# Patient Record
Sex: Male | Born: 2000 | Race: White | Hispanic: No | Marital: Single | Smoking: Never smoker
Health system: Southern US, Community
[De-identification: ages and names within clinical notes are randomized; demographics above are authoritative.]

## PROBLEM LIST (undated history)

## (undated) DIAGNOSIS — F840 Autistic disorder: Secondary | ICD-10-CM

## (undated) DIAGNOSIS — R41840 Attention and concentration deficit: Secondary | ICD-10-CM

## (undated) HISTORY — PX: TONSILLECTOMY: SUR1361

---
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
## 2022-05-28 ENCOUNTER — Observation Stay (HOSPITAL_BASED_OUTPATIENT_CLINIC_OR_DEPARTMENT_OTHER)
Admission: EM | Admit: 2022-05-28 | Discharge: 2022-05-29 | Disposition: A | Discharge status: Home / Self Care | Payer: 59 | Attending: General Surgery | Admitting: General Surgery

## 2022-05-28 ENCOUNTER — Emergency Department (HOSPITAL_BASED_OUTPATIENT_CLINIC_OR_DEPARTMENT_OTHER): Payer: 59

## 2022-05-28 ENCOUNTER — Encounter (HOSPITAL_BASED_OUTPATIENT_CLINIC_OR_DEPARTMENT_OTHER): Payer: Self-pay

## 2022-05-28 ENCOUNTER — Other Ambulatory Visit: Payer: Self-pay

## 2022-05-28 DIAGNOSIS — K8050 Calculus of bile duct without cholangitis or cholecystitis without obstruction: Secondary | ICD-10-CM

## 2022-05-28 DIAGNOSIS — K8010 Calculus of gallbladder with chronic cholecystitis without obstruction: Principal | ICD-10-CM | POA: Insufficient documentation

## 2022-05-28 DIAGNOSIS — K8020 Calculus of gallbladder without cholecystitis without obstruction: Secondary | ICD-10-CM | POA: Diagnosis present

## 2022-05-28 DIAGNOSIS — R1033 Periumbilical pain: Secondary | ICD-10-CM | POA: Diagnosis present

## 2022-05-28 HISTORY — DX: Autistic disorder: F84.0

## 2022-05-28 LAB — CBC WITH DIFFERENTIAL/PLATELET
Abs Immature Granulocytes: 0.03 10*3/uL (ref 0.00–0.07)
Basophils Absolute: 0.1 10*3/uL (ref 0.0–0.1)
Basophils Relative: 1 %
Eosinophils Absolute: 0.5 10*3/uL (ref 0.0–0.5)
Eosinophils Relative: 6 %
HCT: 45.2 % (ref 39.0–52.0)
Hemoglobin: 15.8 g/dL (ref 13.0–17.0)
Immature Granulocytes: 0 %
Lymphocytes Relative: 14 %
Lymphs Abs: 1.3 10*3/uL (ref 0.7–4.0)
MCH: 33.3 pg (ref 26.0–34.0)
MCHC: 35 g/dL (ref 30.0–36.0)
MCV: 95.4 fL (ref 80.0–100.0)
Monocytes Absolute: 0.8 10*3/uL (ref 0.1–1.0)
Monocytes Relative: 9 %
Neutro Abs: 6.5 10*3/uL (ref 1.7–7.7)
Neutrophils Relative %: 70 %
Platelets: 274 10*3/uL (ref 150–400)
RBC: 4.74 MIL/uL (ref 4.22–5.81)
RDW: 12.1 % (ref 11.5–15.5)
WBC: 9.2 10*3/uL (ref 4.0–10.5)
nRBC: 0 % (ref 0.0–0.2)

## 2022-05-28 LAB — COMPREHENSIVE METABOLIC PANEL
ALT: 28 U/L (ref 0–44)
AST: 21 U/L (ref 15–41)
Albumin: 4.3 g/dL (ref 3.5–5.0)
Alkaline Phosphatase: 62 U/L (ref 38–126)
Anion gap: 9 (ref 5–15)
BUN: 6 mg/dL (ref 6–20)
CO2: 28 mmol/L (ref 22–32)
Calcium: 9.3 mg/dL (ref 8.9–10.3)
Chloride: 100 mmol/L (ref 98–111)
Creatinine, Ser: 0.8 mg/dL (ref 0.61–1.24)
GFR, Estimated: >60 mL/min (ref >60)
Glucose, Bld: 104 mg/dL — ABNORMAL HIGH (ref 70–99)
Potassium: 3.8 mmol/L (ref 3.5–5.1)
Sodium: 137 mmol/L (ref 135–145)
Total Bilirubin: 1.5 mg/dL — ABNORMAL HIGH (ref 0.3–1.2)
Total Protein: 7.2 g/dL (ref 6.5–8.1)

## 2022-05-28 LAB — URINALYSIS, ROUTINE W REFLEX MICROSCOPIC
Bilirubin Urine: NEGATIVE
Glucose, UA: NEGATIVE mg/dL
Hgb urine dipstick: NEGATIVE
Ketones, ur: NEGATIVE mg/dL
Leukocytes,Ua: NEGATIVE
Nitrite: NEGATIVE
Protein, ur: NEGATIVE mg/dL
Specific Gravity, Urine: 1.015 (ref 1.005–1.030)
pH: 6 (ref 5.0–8.0)

## 2022-05-28 LAB — LIPASE, BLOOD: Lipase: 30 U/L (ref 11–51)

## 2022-05-28 MED ORDER — SODIUM CHLORIDE 0.9 % IV SOLN: Freq: Once | INTRAVENOUS | Status: AC

## 2022-05-28 MED ORDER — DIPHENHYDRAMINE HCL 25 MG PO CAPS: 25.0000 mg | ORAL_CAPSULE | Freq: Four times a day (QID) | ORAL | Status: DC | PRN

## 2022-05-28 MED ORDER — CIPROFLOXACIN IN D5W 400 MG/200ML IV SOLN: 400.0000 mg | Freq: Two times a day (BID) | INTRAVENOUS | Status: DC

## 2022-05-28 MED ORDER — SODIUM CHLORIDE 0.9 % IV SOLN: INTRAVENOUS | Status: DC

## 2022-05-28 MED ORDER — CIPROFLOXACIN IN D5W 400 MG/200ML IV SOLN
400.0000 mg | Freq: Two times a day (BID) | INTRAVENOUS | Status: DC
  Administered 2022-05-29: 400 mg via INTRAVENOUS
  Filled 2022-05-28: qty 200

## 2022-05-28 MED ORDER — ENOXAPARIN SODIUM 40 MG/0.4ML IJ SOSY
40.0000 mg | PREFILLED_SYRINGE | INTRAMUSCULAR | Status: DC
  Administered 2022-05-28: 40 mg via SUBCUTANEOUS
  Filled 2022-05-28: qty 0.4

## 2022-05-28 MED ORDER — DOCUSATE SODIUM 100 MG PO CAPS: 100.0000 mg | ORAL_CAPSULE | Freq: Two times a day (BID) | ORAL | Status: DC | PRN

## 2022-05-28 MED ORDER — DIPHENHYDRAMINE HCL 50 MG/ML IJ SOLN: 25.0000 mg | Freq: Four times a day (QID) | INTRAMUSCULAR | Status: DC | PRN

## 2022-05-28 MED ORDER — SODIUM CHLORIDE 0.9 % IV BOLUS
1000.0000 mL | Freq: Once | INTRAVENOUS | Status: AC
  Administered 2022-05-28: 1000 mL via INTRAVENOUS

## 2022-05-28 MED ORDER — ONDANSETRON HCL 4 MG/2ML IJ SOLN
4.0000 mg | Freq: Once | INTRAMUSCULAR | Status: AC
  Administered 2022-05-28: 4 mg via INTRAVENOUS
  Filled 2022-05-28: qty 2

## 2022-05-28 MED ORDER — METHOCARBAMOL 500 MG PO TABS
500.0000 mg | ORAL_TABLET | Freq: Three times a day (TID) | ORAL | Status: DC | PRN
  Administered 2022-05-29: 500 mg via ORAL
  Filled 2022-05-28: qty 1

## 2022-05-28 MED ORDER — PANTOPRAZOLE SODIUM 40 MG IV SOLR
40.0000 mg | Freq: Every day | INTRAVENOUS | Status: DC
  Administered 2022-05-28: 40 mg via INTRAVENOUS
  Filled 2022-05-28: qty 10

## 2022-05-28 MED ORDER — ONDANSETRON 4 MG PO TBDP: 4.0000 mg | ORAL_TABLET | Freq: Four times a day (QID) | ORAL | Status: DC | PRN

## 2022-05-28 MED ORDER — SIMETHICONE 80 MG PO CHEW
40.0000 mg | CHEWABLE_TABLET | Freq: Four times a day (QID) | ORAL | Status: DC | PRN
  Filled 2022-05-28: qty 1

## 2022-05-28 MED ORDER — METHOCARBAMOL 1000 MG/10ML IJ SOLN
500.0000 mg | Freq: Three times a day (TID) | INTRAVENOUS | Status: DC | PRN
  Filled 2022-05-28: qty 5

## 2022-05-28 MED ORDER — IOHEXOL 300 MG/ML  SOLN
100.0000 mL | Freq: Once | INTRAMUSCULAR | Status: AC | PRN
  Administered 2022-05-28: 100 mL via INTRAVENOUS

## 2022-05-28 MED ORDER — OXYCODONE HCL 5 MG PO TABS
5.0000 mg | ORAL_TABLET | ORAL | Status: DC | PRN
  Administered 2022-05-29: 10 mg via ORAL
  Filled 2022-05-28: qty 2

## 2022-05-28 MED ORDER — HYDROMORPHONE HCL 1 MG/ML IJ SOLN: 0.5000 mg | INTRAMUSCULAR | Status: DC | PRN

## 2022-05-28 MED ORDER — MELATONIN 3 MG PO TABS
3.0000 mg | ORAL_TABLET | Freq: Every evening | ORAL | Status: DC | PRN
  Administered 2022-05-28: 3 mg via ORAL
  Filled 2022-05-28: qty 1

## 2022-05-28 MED ORDER — ACETAMINOPHEN 650 MG RE SUPP: 650.0000 mg | Freq: Four times a day (QID) | RECTAL | Status: DC | PRN

## 2022-05-28 MED ORDER — ACETAMINOPHEN 325 MG PO TABS: 650.0000 mg | ORAL_TABLET | Freq: Four times a day (QID) | ORAL | Status: DC | PRN

## 2022-05-28 MED ORDER — SODIUM CHLORIDE 0.9 % IV SOLN
2.0000 g | Freq: Once | INTRAVENOUS | Status: AC
  Administered 2022-05-28: 2 g via INTRAVENOUS
  Filled 2022-05-28: qty 20

## 2022-05-28 MED ORDER — ONDANSETRON HCL 4 MG/2ML IJ SOLN: 4.0000 mg | Freq: Four times a day (QID) | INTRAMUSCULAR | Status: DC | PRN

## 2022-05-28 MED ORDER — METOPROLOL TARTRATE 5 MG/5ML IV SOLN: 5.0000 mg | Freq: Four times a day (QID) | INTRAVENOUS | Status: DC | PRN

## 2022-05-28 NOTE — ED Notes (Signed)
Report given to Alva Garnet, [facility] Paramedic.

## 2022-05-28 NOTE — ED Notes (Signed)
Pt unable to provide urine specimen at this time. Pt and mother aware to notify nurse when able to void

## 2022-05-28 NOTE — ED Notes (Addendum)
Called [facility] for Transport at 4:43

## 2022-05-28 NOTE — ED Provider Notes (Signed)
[place] [facility] Provider Note   CSN: [id number] Arrival date & time: 05/28/22  1028     History  Chief Complaint  Patient presents with   Abdominal Pain    Jonathan Cardenas is a 22 y.o. male.  He has PMH of autism.  Presents to ED accompanied by his parents.  He is complaining today of abdominal pain located in the periumbilical area described as sharp and constant that lasted for about 3 hours this morning.  If finally improved enough where he was able to get in the car to come to the ED after taking Rolaids, Pepcid and Pepto-Bismol.    He has been having problems on and off for the past several months.  His mother reports that he has a lot of sensory issues with food and primarily eats goldfish crackers for snacks.    He was having episodes that were less intense and less frequent previously that would resolve on their own.  His PCP recommended magnesium and fiber supplements which did not seem to change the symptoms, though his mother reports they were not doing it consistently.  Over the past week the pain has been lasting longer but more intense, was much worse this morning, patient states he vomited profusely and his mother reports he was curled up in a ball and his abdomen fell very hard when this was going on.  He denies hematemesis, no hematochezia or melena, reports daily bowel movements, no urinary symptoms.  Abdominal Pain      Home Medications Prior to Admission medications   Medication Sig Start Date End Date Taking? Authorizing Provider  cephALEXin (KEFLEX) 250 MG capsule Take 1 capsule (250 mg total) by mouth 3 (three) times daily. 01/30/12   Gwyneth Sprout, MD  ibuprofen (ADVIL,MOTRIN) 200 MG tablet Take 200 mg by mouth every 6 (six) hours as needed.    [provider]  lisdexamfetamine (VYVANSE) 50 MG capsule Take 50 mg by mouth daily.    [provider]  NON FORMULARY     [provider]   ondansetron (ZOFRAN) 4 MG tablet Take 1 tablet (4 mg total) by mouth every 8 (eight) hours as needed for nausea or vomiting. 04/01/15   Rolan Bucco, MD      Allergies    Penicillins    Review of Systems   Review of Systems  Gastrointestinal:  Positive for abdominal pain.    Physical Exam Updated Vital Signs BP (!) 123/95   Pulse 84   Temp 97.9 F (36.6 C) (Oral)   Resp 15   Ht 6' (1.829 m)   Wt 77.1 kg   SpO2 100%   BMI 23.06 kg/m  Physical Exam Vitals and nursing note reviewed.  Constitutional:      General: He is not in acute distress.    Appearance: He is well-developed.  HENT:     Head: Normocephalic and atraumatic.  Eyes:     Conjunctiva/sclera: Conjunctivae normal.  Cardiovascular:     Rate and Rhythm: Normal rate and regular rhythm.     Heart sounds: No murmur heard. Pulmonary:     Effort: Pulmonary effort is normal. No respiratory distress.     Breath sounds: Normal breath sounds.  Abdominal:     Palpations: Abdomen is soft.     Tenderness: There is abdominal tenderness in the right upper quadrant, epigastric area and periumbilical area. There is no right CVA tenderness, left CVA tenderness or rebound. Negative signs include  Murphy's sign and McBurney's sign.  Musculoskeletal:        General: No swelling.     Cervical back: Neck supple.  Skin:    General: Skin is warm and dry.     Capillary Refill: Capillary refill takes less than 2 seconds.  Neurological:     Mental Status: He is alert.  Psychiatric:        Mood and Affect: Mood normal.     ED Results / Procedures / Treatments   Labs (all labs ordered are listed, but only abnormal results are displayed) Labs Reviewed  COMPREHENSIVE METABOLIC PANEL - Abnormal; Notable for the following components:      Result Value   Glucose, Bld 104 (*)    Total Bilirubin 1.5 (*)    All other components within normal limits  CBC WITH DIFFERENTIAL/PLATELET  LIPASE, BLOOD  URINALYSIS, ROUTINE W REFLEX  MICROSCOPIC    EKG None  Radiology US Abdomen Limited RUQ (LIVER/GB)  Result Date: 05/28/2022 CLINICAL DATA:  Acute abdominal pain EXAM: ULTRASOUND ABDOMEN LIMITED RIGHT UPPER QUADRANT COMPARISON:  CT abdomen and pelvis 05/28/2022 FINDINGS: Gallbladder: The single gallstone is seen in the gallbladder neck measuring up to 1.9 cm. Sonographic Eulah Pont sign is negative. There is mild diffuse gallbladder wall thickening measuring up to 4 mm. There is no pericholecystic fluid. Common bile duct: Diameter: 2.7 mm.  No intrahepatic biliary dilatation. Liver: No focal lesion identified. Within normal limits in parenchymal echogenicity. Portal vein is patent on color Doppler imaging with normal direction of blood flow towards the liver. Other: None. IMPRESSION: Cholelithiasis with mild diffuse gallbladder wall thickening. Sonographic Eulah Pont sign is negative. Findings are equivocal for acute cholecystitis. Electronically Signed   By: Darliss Cheney M.D.   On: 05/28/2022 13:50   CT ABDOMEN PELVIS W CONTRAST  Result Date: 05/28/2022 CLINICAL DATA:  Abdominal pain, acute, nonlocalized EXAM: CT ABDOMEN AND PELVIS WITH CONTRAST TECHNIQUE: Multidetector CT imaging of the abdomen and pelvis was performed using the standard protocol following bolus administration of intravenous contrast. RADIATION DOSE REDUCTION: This exam was performed according to the departmental dose-optimization program which includes automated exposure control, adjustment of the mA and/or kV according to patient size and/or use of iterative reconstruction technique. CONTRAST:  OMNIPAQUE IOHEXOL 300 MG/ML  SOLN COMPARISON:  None Available. FINDINGS: Lower chest: No acute abnormality. Hepatobiliary: Large gallstone. Gallbladder wall thickening. No biliary dilation. No suspicious hepatic lesion. Mild focal fatty sparing along the falciform ligament. Pancreas: Unremarkable. No pancreatic ductal dilatation or surrounding inflammatory changes. Spleen:  Normal in size without focal abnormality. Adrenals/Urinary Tract: Adrenal glands are unremarkable. Kidneys are normal, without renal calculi, focal lesion, or hydronephrosis. Bladder is unremarkable. Stomach/Bowel: Stomach is within normal limits. Appendix appears normal. No evidence of bowel wall thickening, distention, or inflammatory changes. Vascular/Lymphatic: No significant vascular findings are present. No enlarged abdominal or pelvic lymph nodes. Reproductive: Prostate is unremarkable. Partially imaged edema in the left scrotum. Other: No abdominal wall hernia or abnormality. No abdominopelvic ascites. Musculoskeletal: No acute or significant osseous findings. IMPRESSION: 1. Large gallstone with gallbladder wall thickening, suspicious for cholecystitis. An ultrasound could further evaluate if clinically warranted. 2. Partially imaged edema in the left scrotum, possibly hydrocele. Scrotal ultrasound could further characterize if clinically warranted. Electronically Signed   By: Feliberto Harts M.D.   On: 05/28/2022 12:58    Procedures Procedures    Medications Ordered in ED Medications  0.9 %  sodium chloride infusion (has no administration in time range)  cefTRIAXone (ROCEPHIN)  2 g in sodium chloride 0.9 % 100 mL IVPB (has no administration in time range)  ondansetron (ZOFRAN) injection 4 mg (4 mg Intravenous Given 05/28/22 1115)  sodium chloride 0.9 % bolus 1,000 mL (0 mLs Intravenous Stopped 05/28/22 1420)  iohexol (OMNIPAQUE) 300 MG/ML solution 100 mL (100 mLs Intravenous Contrast Given 05/28/22 1229)    ED Course/ Medical Decision Making/ A&P Clinical Course as of 05/28/22 1630  Wed May 28, 2022  1406 Equivocal for acute cholecystitis, patient does have some mild right upper quadrant tenderness but negative Murphy sign, general surgery consulted, awaiting callback at this time.  Patient states pain is now significantly improved from this morning [CB]  1619 Patient having worsening  colicky pain, have a gallstone in his gallbladder neck, gallbladder wall thickening with right upper quadrant tenderness, negative Murphy sign, equivocal for acute cholecystitis.  Discussed with general surgery.  I discussed case with Johnny Bridge the on-call PA who states they will give the patient some IV antibiotics and fluids, patient will be transferred to [facility] long with plan for n.p.o. after midnight and cholecystectomy in the morning.  Patient and family agreeable with this. [CB]    Clinical Course User Index [CB] Ma Rings, PA-C                             Medical Decision Making This patient presents to the ED for concern of abdominal pain, this involves an extensive number of treatment options, and is a complaint that carries with it a high risk of complications and morbidity.  The differential diagnosis includes gastritis, gastroenteritis, appendicitis, cholecystitis, diverticulitis, DKA, nephrolithiasis, gastroparesis, other     Additional history obtained:  Additional history obtained from EMR External records from outside source obtained and reviewed including prior PCP notes   Lab Tests:  I Ordered, and personally interpreted labs.  The pertinent results include:  CBC, CMP lipase normal    Imaging Studies ordered:  I ordered imaging studies including ct abd/pelvis  I independently visualized and interpreted imaging which showed large gallstone with gallbladder wall thickening I agree with the radiologist interpretation     Problem List / ED Course / Critical interventions / Medication management  Counseled with worsening continued colicky abdominal pain, CT scan ordered due to persistent and worsening symptoms.  Shows some gallbladder wall thickening and a large gallstone.  Ultrasound ordered for further delineation.  Patient does have a stone in his gallbladder neck and gallbladder wall thickening on ultrasound.  He is very comfortable without any pain meds in  the ED early cholecystitis versus biliary colic.  Discussed with Johnny Bridge, the on-call PA from surgery.  Will plan for admission to [facility] for antibiotics and fluids overnight, patient will remain n.p.o. after midnight and plan on cholecystectomy in the morning.  Discussed with patient and his parents who are agreeable with this.  I ordered medication including zofran for nausea  Reevaluation of the patient after these medicines showed that the patient improved I have reviewed the patients home medicines and have made adjustments as needed       Amount and/or Complexity of Data Reviewed Labs: ordered. Radiology: ordered.  Risk Prescription drug management. Decision regarding hospitalization.           Final Clinical Impression(s) / ED Diagnoses Final diagnoses:  Biliary colic    Rx / DC Orders ED Discharge Orders     None  Josem Kaufmann 05/28/22 1630    Rondel Baton, MD 05/28/22 1700

## 2022-05-28 NOTE — ED Triage Notes (Signed)
Has been having intermittent abdominal pain x 3 months. C/o vomiting x 5 times today.   Autistic, mother states has a limited diet.

## 2022-05-28 NOTE — ED Notes (Signed)
[facility] in to transport to [facility] 

## 2022-05-28 NOTE — ED Notes (Signed)
Called [facility] for consult talked to [facility] at 4::43

## 2022-05-29 ENCOUNTER — Encounter (HOSPITAL_COMMUNITY): Payer: Self-pay

## 2022-05-29 ENCOUNTER — Other Ambulatory Visit: Payer: Self-pay

## 2022-05-29 ENCOUNTER — Observation Stay (HOSPITAL_BASED_OUTPATIENT_CLINIC_OR_DEPARTMENT_OTHER): Payer: 59 | Admitting: Anesthesiology

## 2022-05-29 ENCOUNTER — Encounter (HOSPITAL_COMMUNITY)
Admission: EM | Disposition: A | Discharge status: Home / Self Care | Payer: Self-pay | Source: Home / Self Care | Attending: Emergency Medicine

## 2022-05-29 ENCOUNTER — Observation Stay (HOSPITAL_COMMUNITY): Payer: 59 | Admitting: Anesthesiology

## 2022-05-29 DIAGNOSIS — K8020 Calculus of gallbladder without cholecystitis without obstruction: Secondary | ICD-10-CM

## 2022-05-29 DIAGNOSIS — K8010 Calculus of gallbladder with chronic cholecystitis without obstruction: Secondary | ICD-10-CM | POA: Diagnosis not present

## 2022-05-29 DIAGNOSIS — F909 Attention-deficit hyperactivity disorder, unspecified type: Secondary | ICD-10-CM

## 2022-05-29 HISTORY — PX: CHOLECYSTECTOMY: SHX55

## 2022-05-29 LAB — BASIC METABOLIC PANEL
Anion gap: 9 (ref 5–15)
BUN: 7 mg/dL (ref 6–20)
CO2: 27 mmol/L (ref 22–32)
Calcium: 8.9 mg/dL (ref 8.9–10.3)
Chloride: 102 mmol/L (ref 98–111)
Creatinine, Ser: 0.89 mg/dL (ref 0.61–1.24)
GFR, Estimated: >60 mL/min (ref >60)
Glucose, Bld: 89 mg/dL (ref 70–99)
Potassium: 3.5 mmol/L (ref 3.5–5.1)
Sodium: 138 mmol/L (ref 135–145)

## 2022-05-29 LAB — CBC
HCT: 42.4 % (ref 39.0–52.0)
Hemoglobin: 14.6 g/dL (ref 13.0–17.0)
MCH: 33.8 pg (ref 26.0–34.0)
MCHC: 34.4 g/dL (ref 30.0–36.0)
MCV: 98.1 fL (ref 80.0–100.0)
Platelets: 218 10*3/uL (ref 150–400)
RBC: 4.32 MIL/uL (ref 4.22–5.81)
RDW: 12.3 % (ref 11.5–15.5)
WBC: 6.6 10*3/uL (ref 4.0–10.5)
nRBC: 0 % (ref 0.0–0.2)

## 2022-05-29 LAB — HIV ANTIBODY (ROUTINE TESTING W REFLEX): HIV Screen 4th Generation wRfx: NONREACTIVE

## 2022-05-29 SURGERY — LAPAROSCOPIC CHOLECYSTECTOMY WITH INTRAOPERATIVE CHOLANGIOGRAM
Anesthesia: General

## 2022-05-29 MED ORDER — 0.9 % SODIUM CHLORIDE (POUR BTL) OPTIME
TOPICAL | Status: DC | PRN
  Administered 2022-05-29: 1000 mL

## 2022-05-29 MED ORDER — ONDANSETRON HCL 4 MG/2ML IJ SOLN
INTRAMUSCULAR | Status: AC
  Filled 2022-05-29: qty 2

## 2022-05-29 MED ORDER — BUPIVACAINE HCL (PF) 0.25 % IJ SOLN
INTRAMUSCULAR | Status: DC | PRN
  Administered 2022-05-29: 30 mL

## 2022-05-29 MED ORDER — LACTATED RINGERS IV SOLN: INTRAVENOUS | Status: DC

## 2022-05-29 MED ORDER — FENTANYL CITRATE (PF) 100 MCG/2ML IJ SOLN
INTRAMUSCULAR | Status: DC | PRN
  Administered 2022-05-29: 50 ug via INTRAVENOUS
  Administered 2022-05-29: 100 ug via INTRAVENOUS
  Administered 2022-05-29: 50 ug via INTRAVENOUS

## 2022-05-29 MED ORDER — LABETALOL HCL 5 MG/ML IV SOLN
INTRAVENOUS | Status: AC
  Filled 2022-05-29: qty 4

## 2022-05-29 MED ORDER — LABETALOL HCL 5 MG/ML IV SOLN
INTRAVENOUS | Status: DC | PRN
  Administered 2022-05-29 (×2): 5 mg via INTRAVENOUS

## 2022-05-29 MED ORDER — FENTANYL CITRATE (PF) 100 MCG/2ML IJ SOLN
INTRAMUSCULAR | Status: AC
  Filled 2022-05-29: qty 2

## 2022-05-29 MED ORDER — MIDAZOLAM HCL 2 MG/2ML IJ SOLN
INTRAMUSCULAR | Status: DC | PRN
  Administered 2022-05-29: 2 mg via INTRAVENOUS

## 2022-05-29 MED ORDER — LIDOCAINE HCL (PF) 2 % IJ SOLN
INTRAMUSCULAR | Status: AC
  Filled 2022-05-29: qty 5

## 2022-05-29 MED ORDER — ROCURONIUM BROMIDE 10 MG/ML (PF) SYRINGE
PREFILLED_SYRINGE | INTRAVENOUS | Status: DC | PRN
  Administered 2022-05-29: 60 mg via INTRAVENOUS

## 2022-05-29 MED ORDER — DEXMEDETOMIDINE HCL IN NACL 80 MCG/20ML IV SOLN
INTRAVENOUS | Status: DC | PRN
  Administered 2022-05-29: 8 ug via INTRAVENOUS
  Administered 2022-05-29 (×2): 12 ug via INTRAVENOUS

## 2022-05-29 MED ORDER — ACETAMINOPHEN 160 MG/5ML PO SOLN: 325.0000 mg | ORAL | Status: DC | PRN

## 2022-05-29 MED ORDER — OXYCODONE HCL 5 MG PO TABS: 5.0000 mg | ORAL_TABLET | Freq: Once | ORAL | Status: DC | PRN

## 2022-05-29 MED ORDER — DEXAMETHASONE SODIUM PHOSPHATE 10 MG/ML IJ SOLN
INTRAMUSCULAR | Status: AC
  Filled 2022-05-29: qty 1

## 2022-05-29 MED ORDER — OXYCODONE HCL 5 MG/5ML PO SOLN: 5.0000 mg | Freq: Once | ORAL | Status: DC | PRN

## 2022-05-29 MED ORDER — ACETAMINOPHEN 10 MG/ML IV SOLN
INTRAVENOUS | Status: DC | PRN
  Administered 2022-05-29: 1000 mg via INTRAVENOUS

## 2022-05-29 MED ORDER — ONDANSETRON HCL 4 MG/2ML IJ SOLN: 4.0000 mg | Freq: Once | INTRAMUSCULAR | Status: DC | PRN

## 2022-05-29 MED ORDER — FENTANYL CITRATE PF 50 MCG/ML IJ SOSY
PREFILLED_SYRINGE | INTRAMUSCULAR | Status: AC
  Administered 2022-05-29: 50 ug via INTRAVENOUS
  Filled 2022-05-29: qty 2

## 2022-05-29 MED ORDER — DEXAMETHASONE SODIUM PHOSPHATE 10 MG/ML IJ SOLN
INTRAMUSCULAR | Status: DC | PRN
  Administered 2022-05-29: 10 mg via INTRAVENOUS

## 2022-05-29 MED ORDER — IBUPROFEN 800 MG PO TABS
800.0000 mg | ORAL_TABLET | Freq: Three times a day (TID) | ORAL | 0 refills | Status: AC | PRN
Start: 2022-05-29 — End: ?

## 2022-05-29 MED ORDER — MEPERIDINE HCL 50 MG/ML IJ SOLN: 6.2500 mg | INTRAMUSCULAR | Status: DC | PRN

## 2022-05-29 MED ORDER — OXYCODONE HCL 5 MG PO TABS
5.0000 mg | ORAL_TABLET | Freq: Four times a day (QID) | ORAL | 0 refills | Status: DC | PRN
Start: 2022-05-29 — End: 2023-09-22

## 2022-05-29 MED ORDER — SUGAMMADEX SODIUM 200 MG/2ML IV SOLN
INTRAVENOUS | Status: DC | PRN
  Administered 2022-05-29: 200 mg via INTRAVENOUS

## 2022-05-29 MED ORDER — ROCURONIUM BROMIDE 10 MG/ML (PF) SYRINGE
PREFILLED_SYRINGE | INTRAVENOUS | Status: AC
  Filled 2022-05-29: qty 10

## 2022-05-29 MED ORDER — ACETAMINOPHEN 325 MG PO TABS: 325.0000 mg | ORAL_TABLET | ORAL | Status: DC | PRN

## 2022-05-29 MED ORDER — PROPOFOL 10 MG/ML IV BOLUS
INTRAVENOUS | Status: AC
  Filled 2022-05-29: qty 20

## 2022-05-29 MED ORDER — IBUPROFEN 200 MG PO TABS: 800.0000 mg | ORAL_TABLET | Freq: Three times a day (TID) | ORAL | Status: DC | PRN

## 2022-05-29 MED ORDER — LACTATED RINGERS IR SOLN
Status: DC | PRN
  Administered 2022-05-29: 1000 mL

## 2022-05-29 MED ORDER — BUPIVACAINE HCL (PF) 0.25 % IJ SOLN
INTRAMUSCULAR | Status: AC
  Filled 2022-05-29: qty 30

## 2022-05-29 MED ORDER — FENTANYL CITRATE PF 50 MCG/ML IJ SOSY
25.0000 ug | PREFILLED_SYRINGE | INTRAMUSCULAR | Status: DC | PRN
  Administered 2022-05-29: 50 ug via INTRAVENOUS

## 2022-05-29 MED ORDER — CHLORHEXIDINE GLUCONATE 0.12 % MT SOLN
15.0000 mL | Freq: Once | OROMUCOSAL | Status: AC
  Administered 2022-05-29: 15 mL via OROMUCOSAL

## 2022-05-29 MED ORDER — PROPOFOL 10 MG/ML IV BOLUS
INTRAVENOUS | Status: DC | PRN
  Administered 2022-05-29: 200 mg via INTRAVENOUS

## 2022-05-29 MED ORDER — MIDAZOLAM HCL 2 MG/2ML IJ SOLN
INTRAMUSCULAR | Status: AC
  Filled 2022-05-29: qty 2

## 2022-05-29 MED ORDER — ONDANSETRON HCL 4 MG/2ML IJ SOLN
INTRAMUSCULAR | Status: DC | PRN
  Administered 2022-05-29: 4 mg via INTRAVENOUS

## 2022-05-29 MED ORDER — ACETAMINOPHEN 10 MG/ML IV SOLN
INTRAVENOUS | Status: AC
  Filled 2022-05-29: qty 100

## 2022-05-29 MED ORDER — LIDOCAINE 2% (20 MG/ML) 5 ML SYRINGE
INTRAMUSCULAR | Status: DC | PRN
  Administered 2022-05-29: 100 mg via INTRAVENOUS

## 2022-05-29 MED ORDER — DEXMEDETOMIDINE HCL IN NACL 80 MCG/20ML IV SOLN
INTRAVENOUS | Status: AC
  Filled 2022-05-29: qty 20

## 2022-05-29 SURGICAL SUPPLY — 50 items
ADH SKN CLS LQ APL DERMABOND (GAUZE/BANDAGES/DRESSINGS) ×1
APL PRP STRL LF DISP 70% ISPRP (MISCELLANEOUS) ×1
APL SKNCLS STERI-STRIP NONHPOA (GAUZE/BANDAGES/DRESSINGS)
APPLIER CLIP 5 13 M/L LIGAMAX5 (MISCELLANEOUS) ×1
APPLIER CLIP ROT 10 11.4 M/L (STAPLE)
APR CLP MED LRG 11.4X10 (STAPLE)
APR CLP MED LRG 5 ANG JAW (MISCELLANEOUS) ×1
BAG COUNTER SPONGE SURGICOUNT (BAG) IMPLANT
BAG SPEC RTRVL 10 TROC 200 (ENDOMECHANICALS) ×1
BAG SPNG CNTER NS LX DISP (BAG)
BENZOIN TINCTURE PRP APPL 2/3 (GAUZE/BANDAGES/DRESSINGS) IMPLANT
BNDG ADH 1X3 SHEER STRL LF (GAUZE/BANDAGES/DRESSINGS) IMPLANT
BNDG ADH THN 3X1 STRL LF (GAUZE/BANDAGES/DRESSINGS)
CABLE HIGH FREQUENCY MONO STRZ (ELECTRODE) ×1 IMPLANT
CHLORAPREP W/TINT 26 (MISCELLANEOUS) ×1 IMPLANT
CLIP APPLIE 5 13 M/L LIGAMAX5 (MISCELLANEOUS) ×1 IMPLANT
CLIP APPLIE ROT 10 11.4 M/L (STAPLE) IMPLANT
CLIP LIGATING HEM O LOK PURPLE (MISCELLANEOUS) IMPLANT
CLIP LIGATING HEMO O LOK GREEN (MISCELLANEOUS) ×1 IMPLANT
COVER MAYO STAND XLG (MISCELLANEOUS) ×1 IMPLANT
COVER SURGICAL LIGHT HANDLE (MISCELLANEOUS) ×1 IMPLANT
DERMABOND ADVANCED .7 DNX6 (GAUZE/BANDAGES/DRESSINGS) IMPLANT
DRAIN CHANNEL 19F RND (DRAIN) IMPLANT
DRAPE C-ARM 42X120 X-RAY (DRAPES) IMPLANT
EVACUATOR SILICONE 100CC (DRAIN) IMPLANT
GLOVE BIOGEL PI IND STRL 7.0 (GLOVE) ×1 IMPLANT
GLOVE SURG SS PI 7.0 STRL IVOR (GLOVE) ×1 IMPLANT
GOWN STRL REUS W/ TWL LRG LVL3 (GOWN DISPOSABLE) ×1 IMPLANT
GOWN STRL REUS W/TWL LRG LVL3 (GOWN DISPOSABLE) ×1
GRASPER SUT TROCAR 14GX15 (MISCELLANEOUS) ×1 IMPLANT
IRRIG SUCT STRYKERFLOW 2 WTIP (MISCELLANEOUS) ×1
IRRIGATION SUCT STRKRFLW 2 WTP (MISCELLANEOUS) ×1 IMPLANT
KIT BASIN OR (CUSTOM PROCEDURE TRAY) ×1 IMPLANT
KIT TURNOVER KIT A (KITS) IMPLANT
POUCH RETRIEVAL ECOSAC 10 (ENDOMECHANICALS) ×1 IMPLANT
POUCH RETRIEVAL ECOSAC 10MM (ENDOMECHANICALS) ×1
SCISSORS LAP 5X35 DISP (ENDOMECHANICALS) ×1 IMPLANT
SET CHOLANGIOGRAPH MIX (MISCELLANEOUS) IMPLANT
SET TUBE SMOKE EVAC HIGH FLOW (TUBING) ×1 IMPLANT
SLEEVE Z-THREAD 5X100MM (TROCAR) ×2 IMPLANT
SPIKE FLUID TRANSFER (MISCELLANEOUS) ×1 IMPLANT
STRIP CLOSURE SKIN 1/2X4 (GAUZE/BANDAGES/DRESSINGS) IMPLANT
SUT ETHILON 2 0 PS N (SUTURE) IMPLANT
SUT MNCRL AB 4-0 PS2 18 (SUTURE) ×1 IMPLANT
SUT VICRYL 0 ENDOLOOP (SUTURE) IMPLANT
TOWEL OR 17X26 10 PK STRL BLUE (TOWEL DISPOSABLE) ×1 IMPLANT
TOWEL OR NON WOVEN STRL DISP B (DISPOSABLE) IMPLANT
TRAY LAPAROSCOPIC (CUSTOM PROCEDURE TRAY) ×1 IMPLANT
TROCAR ADV FIXATION 12X100MM (TROCAR) ×1 IMPLANT
TROCAR Z-THREAD OPTICAL 5X100M (TROCAR) ×1 IMPLANT

## 2022-05-29 NOTE — Discharge Instructions (Signed)
[facility] ° °Please arrive at least 30 min before your appointment to complete your check in paperwork.  If you are unable to arrive 30 min prior to your appointment time we may have to cancel or reschedule you. °LAPAROSCOPIC SURGERY: POST OP INSTRUCTIONS °Always review your discharge instruction sheet given to you by the facility where your surgery was performed. °IF YOU HAVE DISABILITY OR FAMILY LEAVE FORMS, YOU MUST BRING THEM TO THE OFFICE FOR PROCESSING.   °DO NOT GIVE THEM TO YOUR DOCTOR. ° °PAIN CONTROL ° °First take acetaminophen (Tylenol) AND/or ibuprofen (Advil) to control your pain after surgery.  Follow directions on package.  Taking acetaminophen (Tylenol) and/or ibuprofen (Advil) regularly after surgery will help to control your pain and lower the amount of prescription pain medication you may need.  You should not take more than 4,000 mg (4 grams) of acetaminophen (Tylenol) in 24 hours.  You should not take ibuprofen (Advil), aleve, motrin, naprosyn or other NSAIDS if you have a history of stomach ulcers or chronic kidney disease.  °A prescription for pain medication may be given to you upon discharge.  Take your pain medication as prescribed, if you still have uncontrolled pain after taking acetaminophen (Tylenol) or ibuprofen (Advil). °Use ice packs to help control pain. °If you need a refill on your pain medication, please contact your pharmacy.  They will contact our office to request authorization. Prescriptions will not be filled after 5pm or on week-ends. ° °HOME MEDICATIONS °Take your usually prescribed medications unless otherwise directed. ° °DIET °You should follow a light diet the first few days after arrival home.  Be sure to include lots of fluids daily. Avoid fatty, fried foods.  ° °CONSTIPATION °It is common to experience some constipation after surgery and if you are taking pain medication.  Increasing fluid intake and taking a stool softener (such as Colace)  will usually help or prevent this problem from occurring.  A mild laxative (Milk of Magnesia or Miralax) should be taken according to package instructions if there are no bowel movements after 48 hours. ° °WOUND/INCISION CARE °Most patients will experience some swelling and bruising in the area of the incisions.  Ice packs will help.  Swelling and bruising can take several days to resolve.  °Unless discharge instructions indicate otherwise, follow guidelines below  °STERI-STRIPS - you may remove your outer bandages 48 hours after surgery, and you may shower at that time.  You have steri-strips (small skin tapes) in place directly over the incision.  These strips should be left on the skin for 7-10 days.   °DERMABOND/SKIN GLUE - you may shower in 24 hours.  The glue will flake off over the next 2-3 weeks. °Any sutures or staples will be removed at the office during your follow-up visit. ° °ACTIVITIES °You may resume regular (light) daily activities beginning the next day--such as daily self-care, walking, climbing stairs--gradually increasing activities as tolerated.  You may have sexual intercourse when it is comfortable.  Refrain from any heavy lifting or straining until approved by your doctor. °You may drive when you are no longer taking prescription pain medication, you can comfortably wear a seatbelt, and you can safely maneuver your car and apply brakes. ° °FOLLOW-UP °You should see your doctor in the office for a follow-up appointment approximately 2-3 weeks after your surgery.  You should have been given your post-op/follow-up appointment when your surgery was scheduled.  If you did not receive a post-op/follow-up appointment, make sure   that you call for this appointment within a day or two after you arrive home to insure a convenient appointment time. ° ° °WHEN TO CALL YOUR DOCTOR: °Fever over 101.0 °Inability to urinate °Continued bleeding from incision. °Increased pain, redness, or drainage from the  incision. °Increasing abdominal pain ° °The clinic staff is available to answer your questions during regular business hours.  Please don’t hesitate to call and ask to speak to one of the nurses for clinical concerns.  If you have a medical emergency, go to the nearest emergency room or call 911.  A surgeon from [facility] is always on call at the hospital. °[address], [address], [place], [place]  [address] ? [address] [address], , [place]   [address] °[phone] ? [phone] ? FAX [phone] ° ° ° ° °Managing Your Pain After Surgery Without Opioids ° ° ° °Thank you for participating in our program to help patients manage their pain after surgery without opioids. This is part of our effort to provide you with the best care possible, without exposing you or your family to the risk that opioids pose. ° °What pain can I expect after surgery? °You can expect to have some pain after surgery. This is normal. The pain is typically worse the day after surgery, and quickly begins to get better. °Many studies have found that many patients are able to manage their pain after surgery with Over-the-Counter (OTC) medications such as Tylenol and Motrin. If you have a condition that does not allow you to take Tylenol or Motrin, notify your surgical team. ° °How will I manage my pain? °The best strategy for controlling your pain after surgery is around the clock pain control with Tylenol (acetaminophen) and Motrin (ibuprofen or Advil). Alternating these medications with each other allows you to maximize your pain control. In addition to Tylenol and Motrin, you can use heating pads or ice packs on your incisions to help reduce your pain. ° °How will I alternate your regular strength over-the-counter pain medication? °You will take a dose of pain medication every three hours. °Start by taking 650 mg of Tylenol (2 pills of 325 mg) °3 hours later take 600 mg of Motrin (3 pills of 200 mg) °3 hours after  taking the Motrin take 650 mg of Tylenol °3 hours after that take 600 mg of Motrin. ° ° °- 1 - ° °See example - if your first dose of Tylenol is at 12:00 PM ° ° °12:00 PM Tylenol 650 mg (2 pills of 325 mg)  °3:00 PM Motrin 600 mg (3 pills of 200 mg)  °6:00 PM Tylenol 650 mg (2 pills of 325 mg)  °9:00 PM Motrin 600 mg (3 pills of 200 mg)  °Continue alternating every 3 hours  ° °We recommend that you follow this schedule around-the-clock for at least 3 days after surgery, or until you feel that it is no longer needed. Use the table on the last page of this handout to keep track of the medications you are taking. °Important: °Do not take more than 3000mg of Tylenol or 3200mg of Motrin in a 24-hour period. °Do not take ibuprofen/Motrin if you have a history of bleeding stomach ulcers, severe kidney disease, &/or actively taking a blood thinner ° °What if I still have pain? °If you have pain that is not controlled with the over-the-counter pain medications (Tylenol and Motrin or Advil) you might have what we call “breakthrough” pain. You will receive a prescription   for a small amount of an opioid pain medication such as Oxycodone, Tramadol, or Tylenol with Codeine. Use these opioid pills in the first 24 hours after surgery if you have breakthrough pain. Do not take more than 1 pill every 4-6 hours. ° °If you still have uncontrolled pain after using all opioid pills, don't hesitate to call our staff using the number provided. We will help make sure you are managing your pain in the best way possible, and if necessary, we can provide a prescription for additional pain medication. ° ° °Day 1   ° °Time  °Name of Medication Number of pills taken  °Amount of Acetaminophen  °Pain Level  ° °Comments  °AM PM       °AM PM       °AM PM       °AM PM       °AM PM       °AM PM       °AM PM       °AM PM       °Total Daily amount of Acetaminophen °Do not take more than  3,000 mg per day    ° ° °Day 2   ° °Time  °Name of Medication  Number of pills °taken  °Amount of Acetaminophen  °Pain Level  ° °Comments  °AM PM       °AM PM       °AM PM       °AM PM       °AM PM       °AM PM       °AM PM       °AM PM       °Total Daily amount of Acetaminophen °Do not take more than  3,000 mg per day    ° ° °Day 3   ° °Time  °Name of Medication Number of pills taken  °Amount of Acetaminophen  °Pain Level  ° °Comments  °AM PM       °AM PM       °AM PM       °AM PM       ° ° ° °AM PM       °AM PM       °AM PM       °AM PM       °Total Daily amount of Acetaminophen °Do not take more than  3,000 mg per day    ° ° °Day 4   ° °Time  °Name of Medication Number of pills taken  °Amount of Acetaminophen  °Pain Level  ° °Comments  °AM PM       °AM PM       °AM PM       °AM PM       °AM PM       °AM PM       °AM PM       °AM PM       °Total Daily amount of Acetaminophen °Do not take more than  3,000 mg per day    ° ° °Day 5   ° °Time  °Name of Medication Number °of pills taken  °Amount of Acetaminophen  °Pain Level  ° °Comments  °AM PM       °AM PM       °AM PM       °AM PM       °AM PM       °AM   PM       °AM PM       °AM PM       °Total Daily amount of Acetaminophen °Do not take more than  3,000 mg per day    ° ° ° °Day 6   ° °Time  °Name of Medication Number of pills °taken  °Amount of Acetaminophen  °Pain Level  °Comments  °AM PM       °AM PM       °AM PM       °AM PM       °AM PM       °AM PM       °AM PM       °AM PM       °Total Daily amount of Acetaminophen °Do not take more than  3,000 mg per day    ° ° °Day 7   ° °Time  °Name of Medication Number of pills taken  °Amount of Acetaminophen  °Pain Level  ° °Comments  °AM PM       °AM PM       °AM PM       °AM PM       °AM PM       °AM PM       °AM PM       °AM PM       °Total Daily amount of Acetaminophen °Do not take more than  3,000 mg per day    ° ° ° ° °For additional information about how and where to safely dispose of unused opioid °medications - https://www.morepowerfulnc.org ° °Disclaimer: This document  contains information and/or instructional materials adapted from [facility] for the typical patient with your condition. It does not replace medical advice from your health care provider because your experience may differ from that of the °typical patient. Talk to your health care provider if you have any questions about this °document, your condition or your treatment plan. °Adapted from [facility] ° °

## 2022-05-29 NOTE — Progress Notes (Signed)
Discharge instructions given to patient and all questions were answered.  

## 2022-05-29 NOTE — Discharge Summary (Signed)
Physician Discharge Summary  DECLIN RAJAN Jonathan Cardenas:[id number] DOB: 2000-09-10 DOA: 05/28/2022  PCP: Marshia Ly, PA-C  Admit date: 05/28/2022 Discharge date:  05/29/2022   Recommendations for Outpatient Follow-up:   (include homehealth, outpatient follow-up instructions, specific recommendations for PCP to follow-up on, etc.)   Follow-up Information     [name], Hedda Slade, PA-C. Call.   Specialty: General Surgery Why: We are making a follow up appointment for you., Please call to confirm appointment time., Arrive 30 minutes early to complete check in, and bring photo ID and insurance card. Contact information: [address] [facility] [place] [address] [phone]                Discharge Diagnoses:  Principal Problem:   Symptomatic cholelithiasis   Surgical Procedure: lap chole  Discharge Condition: Good Disposition: Home  Diet recommendation: low fat diet   Hospital Course:  22 yo male presented to the ED with acute cholecystitis. He was observed in the hospital and received antibiotics. He underwent lap chole and did well he was discharged home POD 0.  Discharge Instructions  Discharge Instructions     Call MD for:  difficulty breathing, headache or visual disturbances   Complete by: As directed    Call MD for:  persistant nausea and vomiting   Complete by: As directed    Call MD for:  redness, tenderness, or signs of infection (pain, swelling, redness, odor or green/yellow discharge around incision site)   Complete by: As directed    Call MD for:  severe uncontrolled pain   Complete by: As directed    Call MD for:  temperature >100.4   Complete by: As directed    Diet - low sodium heart healthy   Complete by: As directed    Discharge wound care:   Complete by: As directed    Shower normal tomorrow. Glue to stay on for 10-14 days. No bandage needed.   Increase activity slowly   Complete by: As directed        Allergies as of 05/29/2022       Reactions   Penicillins Hives   Tolerated cephalosporin        Medication List     STOP taking these medications    cephALEXin 250 MG capsule Commonly known as: KEFLEX       TAKE these medications    ibuprofen 800 MG tablet Commonly known as: ADVIL Take 1 tablet (800 mg total) by mouth every 8 (eight) hours as needed. What changed:  medication strength how much to take when to take this reasons to take this   lisdexamfetamine 50 MG capsule Commonly known as: VYVANSE Take 50 mg by mouth daily.   Melatonin 10 MG Tabs Take 1 tablet by mouth at bedtime.   ondansetron 4 MG tablet Commonly known as: Zofran Take 1 tablet (4 mg total) by mouth every 8 (eight) hours as needed for nausea or vomiting. What changed: when to take this   oxyCODONE 5 MG immediate release tablet Commonly known as: Oxy IR/ROXICODONE Take 1 tablet (5 mg total) by mouth every 6 (six) hours as needed for severe pain.               Discharge Care Instructions  (From admission, onward)           Start     Ordered   05/29/22 0000  Discharge wound care:       Comments: Shower normal tomorrow.  Glue to stay on for 10-14 days. No bandage needed.   05/29/22 1546            Follow-up Information     [name], Hedda Slade, PA-C. Call.   Specialty: General Surgery Why: We are making a follow up appointment for you., Please call to confirm appointment time., Arrive 30 minutes early to complete check in, and bring photo ID and insurance card. Contact information: [address] [facility] [place] [address] [phone]                  The results of significant diagnostics from this hospitalization (including imaging, microbiology, ancillary and laboratory) are listed below for reference.    Significant Diagnostic Studies: US Abdomen Limited RUQ (LIVER/GB)  Result Date: 05/28/2022 CLINICAL DATA:  Acute  abdominal pain EXAM: ULTRASOUND ABDOMEN LIMITED RIGHT UPPER QUADRANT COMPARISON:  CT abdomen and pelvis 05/28/2022 FINDINGS: Gallbladder: The single gallstone is seen in the gallbladder neck measuring up to 1.9 cm. Sonographic Eulah Pont sign is negative. There is mild diffuse gallbladder wall thickening measuring up to 4 mm. There is no pericholecystic fluid. Common bile duct: Diameter: 2.7 mm.  No intrahepatic biliary dilatation. Liver: No focal lesion identified. Within normal limits in parenchymal echogenicity. Portal vein is patent on color Doppler imaging with normal direction of blood flow towards the liver. Other: None. IMPRESSION: Cholelithiasis with mild diffuse gallbladder wall thickening. Sonographic Eulah Pont sign is negative. Findings are equivocal for acute cholecystitis. Electronically Signed   By: Darliss Cheney M.D.   On: 05/28/2022 13:50   CT ABDOMEN PELVIS W CONTRAST  Result Date: 05/28/2022 CLINICAL DATA:  Abdominal pain, acute, nonlocalized EXAM: CT ABDOMEN AND PELVIS WITH CONTRAST TECHNIQUE: Multidetector CT imaging of the abdomen and pelvis was performed using the standard protocol following bolus administration of intravenous contrast. RADIATION DOSE REDUCTION: This exam was performed according to the departmental dose-optimization program which includes automated exposure control, adjustment of the mA and/or kV according to patient size and/or use of iterative reconstruction technique. CONTRAST:  OMNIPAQUE IOHEXOL 300 MG/ML  SOLN COMPARISON:  None Available. FINDINGS: Lower chest: No acute abnormality. Hepatobiliary: Large gallstone. Gallbladder wall thickening. No biliary dilation. No suspicious hepatic lesion. Mild focal fatty sparing along the falciform ligament. Pancreas: Unremarkable. No pancreatic ductal dilatation or surrounding inflammatory changes. Spleen: Normal in size without focal abnormality. Adrenals/Urinary Tract: Adrenal glands are unremarkable. Kidneys are normal,  without renal calculi, focal lesion, or hydronephrosis. Bladder is unremarkable. Stomach/Bowel: Stomach is within normal limits. Appendix appears normal. No evidence of bowel wall thickening, distention, or inflammatory changes. Vascular/Lymphatic: No significant vascular findings are present. No enlarged abdominal or pelvic lymph nodes. Reproductive: Prostate is unremarkable. Partially imaged edema in the left scrotum. Other: No abdominal wall hernia or abnormality. No abdominopelvic ascites. Musculoskeletal: No acute or significant osseous findings. IMPRESSION: 1. Large gallstone with gallbladder wall thickening, suspicious for cholecystitis. An ultrasound could further evaluate if clinically warranted. 2. Partially imaged edema in the left scrotum, possibly hydrocele. Scrotal ultrasound could further characterize if clinically warranted. Electronically Signed   By: Feliberto Harts M.D.   On: 05/28/2022 12:58    Labs: Basic Metabolic Panel: Recent Labs  Lab 05/28/22 1101 05/29/22 0456  NA 137 138  K 3.8 3.5  CL 100 102  CO2 28 27  GLUCOSE 104* 89  BUN 6 7  CREATININE 0.80 0.89  CALCIUM 9.3 8.9   Liver Function Tests: Recent Labs  Lab 05/28/22 1101  AST  21  ALT 28  ALKPHOS 62  BILITOT 1.5*  PROT 7.2  ALBUMIN 4.3    CBC: Recent Labs  Lab 05/28/22 1101 05/29/22 0456  WBC 9.2 6.6  NEUTROABS 6.5  --   HGB 15.8 14.6  HCT 45.2 42.4  MCV 95.4 98.1  PLT 274 218    CBG: No results for input(s): "GLUCAP" in the last 168 hours.  Principal Problem:   Symptomatic cholelithiasis   Time coordinating discharge: 15 min

## 2022-05-29 NOTE — Op Note (Signed)
PATIENT:  Jonathan Cardenas  22 y.o. male  PRE-OPERATIVE DIAGNOSIS:  cholelithiasis  POST-OPERATIVE DIAGNOSIS:  cholelithiasis  PROCEDURE:  Procedure(s): LAPAROSCOPIC CHOLECYSTECTOMY   SURGEON:  Starlina Lapre, De Blanch, MD   ASSISTANT: none  ANESTHESIA:   local and general  Indications for procedure: Jonathan Cardenas is a 22 y.o. male with symptoms of Abdominal pain and Nausea and vomiting consistent with gallbladder disease, Confirmed by ultrasound.  Description of procedure: The patient was brought into the operative suite, placed supine. Anesthesia was administered with endotracheal tube. Patient was strapped in place and foot board was secured. All pressure points were offloaded by foam padding. The patient was prepped and draped in the usual sterile fashion.  A periumbilical incision was made and optical entry was used to enter the abdomen. 2 5 mm trocars were placed on in the right lateral space on in the right subcostal space. A 12mm trocar was placed in the subxiphoid space. Marcaine was infused to the subxiphoid space and lateral upper right abdomen in the transversus abdominis plane. Next the patient was placed in reverse trendelenberg. The gallbladder appearedfull of stones and acutely inflamed.   The gallbladder was retracted cephalad and lateral. The peritoneum was reflected off the infundibulum working lateral to medial. The cystic duct and cystic artery were identified and further dissection revealed a critical view. The cystic duct and cystic artery were doubly clipped and ligated.   The gallbladder was removed off the liver bed with cautery. The Gallbladder was placed in a specimen bag. The gallbladder fossa was irrigated and hemostasis was applied with cautery. The gallbladder was removed via the 12mm trocar. The fascial defect was closed with interrupted 0 vicryl suture via laparoscopic trans-fascial suture passer. Pneumoperitoneum was removed, all trocar were removed. All  incisions were closed with 4-0 monocryl subcuticular stitch. The patient woke from anesthesia and was brought to PACU in stable condition. All counts were correct  Findings: acute calculous cholecystitis  Specimen: gallbladder  Blood loss: 50 ml  Local anesthesia: 30 ml Marcaine  Complications: none  PLAN OF CARE: Admit for overnight observation  PATIENT DISPOSITION:  PACU - hemodynamically stable.  De Blanch [facility] [place]

## 2022-05-29 NOTE — TOC CM/SW Note (Signed)
Transition of Care [facility]) Screening Note  Patient Details  Name: Jonathan Cardenas Date of Birth: [birth year]  Transition of Care [facility]) CM/SW Contact:    Ewing Schlein, LCSW Phone Number: 05/29/2022, 11:19 AM  Transition of Care Department [facility]) has reviewed patient and no TOC needs have been identified at this time. We will continue to monitor patient advancement through interdisciplinary progression rounds. If new patient transition needs arise, please place a TOC consult.

## 2022-05-29 NOTE — H&P (Signed)
Reason for Consult:abdominal pain Referring Provider: [name] is an 22 y.o. male.  HPI: 22 yo male with intermittent abdominal pain. It has been occurring for the last 4 months. It lasts 1-2 hours then resolves. He eats only a few foods: chicken nuggets, fries, and goldfish. He denies nausea or pain at this time.  Past Medical History:  Diagnosis Date   Attention and concentration deficit    Autism     Past Surgical History:  Procedure Laterality Date   TONSILLECTOMY      History reviewed. No pertinent family history.  Social History:  reports that he has never smoked. He has never used smokeless tobacco. He reports that he does not drink alcohol and does not use drugs.  Allergies:  Allergies  Allergen Reactions   Penicillins Hives    Medications: I have reviewed the patient's current medications.  Results for orders placed or performed during the hospital encounter of 05/28/22 (from the past 48 hour(s))  CBC with Differential     Status: None   Collection Time: 05/28/22 11:01 AM  Result Value Ref Range   WBC 9.2 4.0 - 10.5 K/uL   RBC 4.74 4.22 - 5.81 MIL/uL   Hemoglobin 15.8 13.0 - 17.0 g/dL   HCT 16.0 10.9 - 32.3 %   MCV 95.4 80.0 - 100.0 fL   MCH 33.3 26.0 - 34.0 pg   MCHC 35.0 30.0 - 36.0 g/dL   RDW 55.7 32.2 - 02.5 %   Platelets 274 150 - 400 K/uL   nRBC 0.0 0.0 - 0.2 %   Neutrophils Relative % 70 %   Neutro Abs 6.5 1.7 - 7.7 K/uL   Lymphocytes Relative 14 %   Lymphs Abs 1.3 0.7 - 4.0 K/uL   Monocytes Relative 9 %   Monocytes Absolute 0.8 0.1 - 1.0 K/uL   Eosinophils Relative 6 %   Eosinophils Absolute 0.5 0.0 - 0.5 K/uL   Basophils Relative 1 %   Basophils Absolute 0.1 0.0 - 0.1 K/uL   Immature Granulocytes 0 %   Abs Immature Granulocytes 0.03 0.00 - 0.07 K/uL    Comment: Performed at [facility], [address] [place], [place] [address]  Comprehensive metabolic panel     Status: Abnormal   Collection Time:  05/28/22 11:01 AM  Result Value Ref Range   Sodium 137 135 - 145 mmol/L   Potassium 3.8 3.5 - 5.1 mmol/L   Chloride 100 98 - 111 mmol/L   CO2 28 22 - 32 mmol/L   Glucose, Bld 104 (H) 70 - 99 mg/dL    Comment: Glucose reference range applies only to samples taken after fasting for at least 8 hours.   BUN 6 6 - 20 mg/dL   Creatinine, Ser 2.37 0.61 - 1.24 mg/dL   Calcium 9.3 8.9 - 62.8 mg/dL   Total Protein 7.2 6.5 - 8.1 g/dL   Albumin 4.3 3.5 - 5.0 g/dL   AST 21 15 - 41 U/L   ALT 28 0 - 44 U/L   Alkaline Phosphatase 62 38 - 126 U/L   Total Bilirubin 1.5 (H) 0.3 - 1.2 mg/dL   GFR, Estimated >31 >51 mL/min    Comment: (NOTE) Calculated using the CKD-EPI Creatinine Equation (2021)    Anion gap 9 5 - 15    Comment: Performed at [facility], [address] [place], [place] [address]  Lipase, blood     Status: None  Collection Time: 05/28/22 11:01 AM  Result Value Ref Range   Lipase 30 11 - 51 U/L    Comment: Performed at [facility], [address] [place], [place] [address]  Urinalysis, Routine w reflex microscopic -Urine, Clean Catch     Status: None   Collection Time: 05/28/22 11:01 AM  Result Value Ref Range   Color, Urine YELLOW YELLOW   APPearance CLEAR CLEAR   Specific Gravity, Urine 1.015 1.005 - 1.030   pH 6.0 5.0 - 8.0   Glucose, UA NEGATIVE NEGATIVE mg/dL   Hgb urine dipstick NEGATIVE NEGATIVE   Bilirubin Urine NEGATIVE NEGATIVE   Ketones, ur NEGATIVE NEGATIVE mg/dL   Protein, ur NEGATIVE NEGATIVE mg/dL   Nitrite NEGATIVE NEGATIVE   Leukocytes,Ua NEGATIVE NEGATIVE    Comment: Microscopic not done on urines with negative protein, blood, leukocytes, nitrite, or glucose < 500 mg/dL. Performed at [facility], [address] [place], [place] [address]   HIV Antibody (routine testing w rflx)     Status: None   Collection Time: 05/29/22  4:56 AM  Result Value Ref Range   HIV Screen 4th Generation wRfx Non Reactive Non Reactive     Comment: Performed at [facility], [address], [place], [place] [address]  Basic metabolic panel     Status: None   Collection Time: 05/29/22  4:56 AM  Result Value Ref Range   Sodium 138 135 - 145 mmol/L   Potassium 3.5 3.5 - 5.1 mmol/L   Chloride 102 98 - 111 mmol/L   CO2 27 22 - 32 mmol/L   Glucose, Bld 89 70 - 99 mg/dL    Comment: Glucose reference range applies only to samples taken after fasting for at least 8 hours.   BUN 7 6 - 20 mg/dL   Creatinine, Ser 9.14 0.61 - 1.24 mg/dL   Calcium 8.9 8.9 - 78.2 mg/dL   GFR, Estimated >95 >62 mL/min    Comment: (NOTE) Calculated using the CKD-EPI Creatinine Equation (2021)    Anion gap 9 5 - 15    Comment: Performed at [facility], [address] [place], [place] [address]  CBC     Status: None   Collection Time: 05/29/22  4:56 AM  Result Value Ref Range   WBC 6.6 4.0 - 10.5 K/uL   RBC 4.32 4.22 - 5.81 MIL/uL   Hemoglobin 14.6 13.0 - 17.0 g/dL   HCT 57.8 46.9 - 62.9 %   MCV 98.1 80.0 - 100.0 fL   MCH 33.8 26.0 - 34.0 pg   MCHC 34.4 30.0 - 36.0 g/dL   RDW 52.8 41.3 - 24.4 %   Platelets 218 150 - 400 K/uL   nRBC 0.0 0.0 - 0.2 %    Comment: Performed at [facility], [address], [place], [place] [address]    US Abdomen Limited RUQ (LIVER/GB)  Result Date: 05/28/2022 CLINICAL DATA:  Acute abdominal pain EXAM: ULTRASOUND ABDOMEN LIMITED RIGHT UPPER QUADRANT COMPARISON:  CT abdomen and pelvis 05/28/2022 FINDINGS: Gallbladder: The single gallstone is seen in the gallbladder neck measuring up to 1.9 cm. Sonographic Eulah Pont sign is negative. There is mild diffuse gallbladder wall thickening measuring up to 4 mm. There is no pericholecystic fluid. Common bile duct: Diameter: 2.7 mm.  No intrahepatic biliary dilatation. Liver: No focal lesion identified. Within normal limits in parenchymal echogenicity. Portal vein is patent on color Doppler imaging with normal direction of blood  flow  towards the liver. Other: None. IMPRESSION: Cholelithiasis with mild diffuse gallbladder wall thickening. Sonographic Eulah Pont sign is negative. Findings are equivocal for acute cholecystitis. Electronically Signed   By: Darliss Cheney M.D.   On: 05/28/2022 13:50   CT ABDOMEN PELVIS W CONTRAST  Result Date: 05/28/2022 CLINICAL DATA:  Abdominal pain, acute, nonlocalized EXAM: CT ABDOMEN AND PELVIS WITH CONTRAST TECHNIQUE: Multidetector CT imaging of the abdomen and pelvis was performed using the standard protocol following bolus administration of intravenous contrast. RADIATION DOSE REDUCTION: This exam was performed according to the departmental dose-optimization program which includes automated exposure control, adjustment of the mA and/or kV according to patient size and/or use of iterative reconstruction technique. CONTRAST:  OMNIPAQUE IOHEXOL 300 MG/ML  SOLN COMPARISON:  None Available. FINDINGS: Lower chest: No acute abnormality. Hepatobiliary: Large gallstone. Gallbladder wall thickening. No biliary dilation. No suspicious hepatic lesion. Mild focal fatty sparing along the falciform ligament. Pancreas: Unremarkable. No pancreatic ductal dilatation or surrounding inflammatory changes. Spleen: Normal in size without focal abnormality. Adrenals/Urinary Tract: Adrenal glands are unremarkable. Kidneys are normal, without renal calculi, focal lesion, or hydronephrosis. Bladder is unremarkable. Stomach/Bowel: Stomach is within normal limits. Appendix appears normal. No evidence of bowel wall thickening, distention, or inflammatory changes. Vascular/Lymphatic: No significant vascular findings are present. No enlarged abdominal or pelvic lymph nodes. Reproductive: Prostate is unremarkable. Partially imaged edema in the left scrotum. Other: No abdominal wall hernia or abnormality. No abdominopelvic ascites. Musculoskeletal: No acute or significant osseous findings. IMPRESSION: 1. Large gallstone with gallbladder  wall thickening, suspicious for cholecystitis. An ultrasound could further evaluate if clinically warranted. 2. Partially imaged edema in the left scrotum, possibly hydrocele. Scrotal ultrasound could further characterize if clinically warranted. Electronically Signed   By: Feliberto Harts M.D.   On: 05/28/2022 12:58    Review of Systems  Constitutional: Negative.   HENT: Negative.    Eyes: Negative.   Respiratory: Negative.    Cardiovascular: Negative.   Gastrointestinal:  Positive for abdominal pain and nausea.  Genitourinary: Negative.   Musculoskeletal: Negative.   Skin: Negative.   Neurological: Negative.   Endo/Heme/Allergies: Negative.   Psychiatric/Behavioral: Negative.      PE Blood pressure 110/62, pulse 82, temperature 97.7 F (36.5 C), temperature source Oral, resp. rate 20, height 6' (1.829 m), weight 77.1 kg, SpO2 98 %. Constitutional: NAD; conversant; no deformities Eyes: Moist conjunctiva; no lid lag; anicteric; PERRL Neck: Trachea midline; no thyromegaly Lungs: Normal respiratory effort; no tactile fremitus CV: RRR; no palpable thrills; no pitting edema GI: Abd soft, NT, ND; no palpable hepatosplenomegaly MSK: Normal gait; no clubbing/cyanosis Psychiatric: Appropriate affect; alert and oriented x3 Lymphatic: No palpable cervical or axillary lymphadenopathy Skin: No major subcutaneous nodules. Warm and dry   Assessment/Plan: 22 yo male with cholecystitis -IV abx -OR for lap chole -We discussed the etiology of her pain, we discussed treatment options and recommended surgery. We discussed details of surgery including general anesthesia, laparoscopic approach, identification of cystic duct and common bile duct. Ligation of cystic duct and cystic artery. Possible need for intraoperative cholangiogram or open procedure. Possible risks of common bile duct injury, liver injury, cystic duct leak, bleeding, infection, post-cholecystectomy syndrome. The patient showed good  understanding and all questions were answered   I reviewed last 24 h vitals and pain scores, last 48 h intake and output, last 24 h labs and trends, and last 24 h imaging results.  This care required high  level of medical decision making.   Franky Macho  Clifton Custard Jillisa Harris 05/29/2022, 9:01 AM

## 2022-05-29 NOTE — Transfer of Care (Signed)
Immediate Anesthesia Transfer of Care Note  Patient: Jonathan Cardenas  Procedure(s) Performed: LAPAROSCOPIC CHOLECYSTECTOMY  Patient Location: PACU  Anesthesia Type:General  Level of Consciousness: drowsy  Airway & Oxygen Therapy: Patient Spontanous Breathing and Patient connected to face mask oxygen  Post-op Assessment: Report given to RN and Post -op Vital signs reviewed and stable  Post vital signs: Reviewed and stable  Last Vitals:  Vitals Value Taken Time  BP 133/100 05/29/22 1106  Temp    Pulse 85 05/29/22 1107  Resp 22 05/29/22 1107  SpO2 100 % 05/29/22 1107  Vitals shown include unvalidated device data.  Last Pain:  Vitals:   05/29/22 0903  TempSrc: Oral  PainSc:          Complications: No notable events documented.

## 2022-05-29 NOTE — Anesthesia Preprocedure Evaluation (Addendum)
Anesthesia Evaluation  Patient identified by MRN, date of birth, ID band Patient awake    Reviewed: Allergy & Precautions, H&P , NPO status , Patient's Chart, lab work & pertinent test results  Airway Mallampati: II  TM Distance: >3 FB Neck ROM: Full    Dental no notable dental hx. (+) Teeth Intact, Dental Advisory Given   Pulmonary neg pulmonary ROS   Pulmonary exam normal breath sounds clear to auscultation       Cardiovascular Exercise Tolerance: Good negative cardio ROS Normal cardiovascular exam Rhythm:Regular Rate:Normal     Neuro/Psych  PSYCHIATRIC DISORDERS      negative neurological ROS  negative psych ROS   GI/Hepatic negative GI ROS, Neg liver ROS,,,  Endo/Other  negative endocrine ROS    Renal/GU negative Renal ROS  negative genitourinary   Musculoskeletal negative musculoskeletal ROS (+)    Abdominal   Peds negative pediatric ROS (+) ATTENTION DEFICIT DISORDER WITHOUT HYPERACTIVITY and Neurological problem Hematology negative hematology ROS (+)   Anesthesia Other Findings   Reproductive/Obstetrics negative OB ROS                             Anesthesia Physical Anesthesia Plan  ASA: 3 and emergent  Anesthesia Plan: General   Post-op Pain Management: Toradol IV (intra-op)* and Ofirmev IV (intra-op)*   Induction: Intravenous and Cricoid pressure planned  PONV Risk Score and Plan: 2 and Ondansetron and Dexamethasone  Airway Management Planned: Oral ETT  Additional Equipment: None  Intra-op Plan:   Post-operative Plan: Extubation in OR  Informed Consent: I have reviewed the patients History and Physical, chart, labs and discussed the procedure including the risks, benefits and alternatives for the proposed anesthesia with the patient or authorized representative who has indicated his/her understanding and acceptance.       Plan Discussed with: Anesthesiologist  and CRNA  Anesthesia Plan Comments: (  )        Anesthesia Quick Evaluation

## 2022-05-29 NOTE — Anesthesia Procedure Notes (Signed)
Procedure Name: Intubation Date/Time: 05/29/2022 10:00 AM  Performed by: Florene Route, CRNAPre-anesthesia Checklist: Patient identified, Emergency Drugs available, Suction available and Patient being monitored Patient Re-evaluated:Patient Re-evaluated prior to induction Oxygen Delivery Method: Circle system utilized Preoxygenation: Pre-oxygenation with 100% oxygen Induction Type: IV induction Ventilation: Mask ventilation without difficulty and Oral airway inserted - appropriate to patient size Laryngoscope Size: Hyacinth Meeker and 3 Grade View: Grade I Tube type: Oral Tube size: 7.5 mm Number of attempts: 1 Airway Equipment and Method: Stylet and Oral airway Placement Confirmation: ETT inserted through vocal cords under direct vision, positive ETCO2 and breath sounds checked- equal and bilateral Secured at: 21 cm Tube secured with: Tape Dental Injury: Teeth and Oropharynx as per pre-operative assessment

## 2022-05-29 NOTE — Anesthesia Postprocedure Evaluation (Signed)
Anesthesia Post Note  Patient: Jonathan Cardenas  Procedure(s) Performed: LAPAROSCOPIC CHOLECYSTECTOMY     Patient location during evaluation: PACU Anesthesia Type: General Level of consciousness: awake and alert Pain management: pain level controlled Vital Signs Assessment: post-procedure vital signs reviewed and stable Respiratory status: spontaneous breathing, nonlabored ventilation, respiratory function stable and patient connected to nasal cannula oxygen Cardiovascular status: blood pressure returned to baseline and stable Postop Assessment: no apparent nausea or vomiting Anesthetic complications: no   No notable events documented.  Last Vitals:  Vitals:   05/29/22 1200 05/29/22 1211  BP: (!) 128/97 (!) 136/96  Pulse: 80 92  Resp: 16 18  Temp:    SpO2: 100% 100%    Last Pain:  Vitals:   05/29/22 1220  TempSrc:   PainSc: 8                  Charleston Vierling

## 2022-05-30 ENCOUNTER — Encounter (HOSPITAL_COMMUNITY): Payer: Self-pay | Admitting: General Surgery

## 2022-05-30 LAB — SURGICAL PATHOLOGY

## 2022-05-30 LAB — NASOPHARYNGEAL CULTURE: Special Requests: NORMAL

## 2022-06-01 LAB — NASOPHARYNGEAL CULTURE

## 2023-09-19 ENCOUNTER — Observation Stay (HOSPITAL_BASED_OUTPATIENT_CLINIC_OR_DEPARTMENT_OTHER)
Admission: EM | Admit: 2023-09-19 | Discharge: 2023-09-22 | Disposition: A | Discharge status: Home / Self Care | Payer: MEDICAID | Attending: General Surgery | Admitting: General Surgery

## 2023-09-19 ENCOUNTER — Emergency Department (HOSPITAL_BASED_OUTPATIENT_CLINIC_OR_DEPARTMENT_OTHER): Payer: MEDICAID

## 2023-09-19 ENCOUNTER — Other Ambulatory Visit: Payer: Self-pay

## 2023-09-19 ENCOUNTER — Encounter (HOSPITAL_BASED_OUTPATIENT_CLINIC_OR_DEPARTMENT_OTHER): Payer: Self-pay | Admitting: Emergency Medicine

## 2023-09-19 DIAGNOSIS — K3580 Unspecified acute appendicitis: Principal | ICD-10-CM | POA: Diagnosis present

## 2023-09-19 DIAGNOSIS — K37 Unspecified appendicitis: Secondary | ICD-10-CM | POA: Diagnosis present

## 2023-09-19 DIAGNOSIS — K36 Other appendicitis: Secondary | ICD-10-CM | POA: Diagnosis not present

## 2023-09-19 LAB — CBC WITH DIFFERENTIAL/PLATELET
Abs Immature Granulocytes: 0.04 K/uL (ref 0.00–0.07)
Basophils Absolute: 0 K/uL (ref 0.0–0.1)
Basophils Relative: 0 %
Eosinophils Absolute: 0.2 K/uL (ref 0.0–0.5)
Eosinophils Relative: 2 %
HCT: 46.1 % (ref 39.0–52.0)
Hemoglobin: 16.4 g/dL (ref 13.0–17.0)
Immature Granulocytes: 0 %
Lymphocytes Relative: 11 %
Lymphs Abs: 1.3 K/uL (ref 0.7–4.0)
MCH: 33.5 pg (ref 26.0–34.0)
MCHC: 35.6 g/dL (ref 30.0–36.0)
MCV: 94.3 fL (ref 80.0–100.0)
Monocytes Absolute: 1.7 K/uL — ABNORMAL HIGH (ref 0.1–1.0)
Monocytes Relative: 15 %
Neutro Abs: 8.4 K/uL — ABNORMAL HIGH (ref 1.7–7.7)
Neutrophils Relative %: 72 %
Platelets: 267 K/uL (ref 150–400)
RBC: 4.89 MIL/uL (ref 4.22–5.81)
RDW: 11.8 % (ref 11.5–15.5)
WBC: 11.6 K/uL — ABNORMAL HIGH (ref 4.0–10.5)
nRBC: 0 % (ref 0.0–0.2)

## 2023-09-19 LAB — URINALYSIS, ROUTINE W REFLEX MICROSCOPIC
Bilirubin Urine: NEGATIVE
Glucose, UA: NEGATIVE mg/dL
Ketones, ur: NEGATIVE mg/dL
Leukocytes,Ua: NEGATIVE
Nitrite: NEGATIVE
Protein, ur: NEGATIVE mg/dL
Specific Gravity, Urine: 1.01 (ref 1.005–1.030)
pH: 8 (ref 5.0–8.0)

## 2023-09-19 LAB — URINALYSIS, MICROSCOPIC (REFLEX)
Squamous Epithelial / HPF: NONE SEEN /HPF (ref 0–5)
WBC, UA: NONE SEEN WBC/hpf (ref 0–5)

## 2023-09-19 LAB — COMPREHENSIVE METABOLIC PANEL WITH GFR
ALT: 20 U/L (ref 0–44)
AST: 22 U/L (ref 15–41)
Albumin: 4.6 g/dL (ref 3.5–5.0)
Alkaline Phosphatase: 70 U/L (ref 38–126)
Anion gap: 14 (ref 5–15)
BUN: 7 mg/dL (ref 6–20)
CO2: 24 mmol/L (ref 22–32)
Calcium: 9.7 mg/dL (ref 8.9–10.3)
Chloride: 99 mmol/L (ref 98–111)
Creatinine, Ser: 0.88 mg/dL (ref 0.61–1.24)
GFR, Estimated: >60 mL/min (ref >60)
Glucose, Bld: 92 mg/dL (ref 70–99)
Potassium: 3.8 mmol/L (ref 3.5–5.1)
Sodium: 136 mmol/L (ref 135–145)
Total Bilirubin: 3.2 mg/dL — ABNORMAL HIGH (ref 0.0–1.2)
Total Protein: 7.6 g/dL (ref 6.5–8.1)

## 2023-09-19 LAB — LIPASE, BLOOD: Lipase: 18 U/L (ref 11–51)

## 2023-09-19 LAB — LACTIC ACID, PLASMA
Lactic Acid, Venous: 0.8 mmol/L (ref 0.5–1.9)
Lactic Acid, Venous: 1.1 mmol/L (ref 0.5–1.9)

## 2023-09-19 LAB — MRSA NEXT GEN BY PCR, NASAL: MRSA by PCR Next Gen: NOT DETECTED

## 2023-09-19 MED ORDER — SODIUM CHLORIDE 0.9 % IV SOLN
2.0000 g | Freq: Every day | INTRAVENOUS | Status: DC
  Administered 2023-09-20: 2 g via INTRAVENOUS
  Filled 2023-09-19: qty 20

## 2023-09-19 MED ORDER — ONDANSETRON 4 MG PO TBDP: 4.0000 mg | ORAL_TABLET | Freq: Four times a day (QID) | ORAL | Status: DC | PRN

## 2023-09-19 MED ORDER — SODIUM CHLORIDE 0.9 % IV SOLN: Freq: Once | INTRAVENOUS | Status: AC

## 2023-09-19 MED ORDER — METRONIDAZOLE 500 MG/100ML IV SOLN
500.0000 mg | Freq: Two times a day (BID) | INTRAVENOUS | Status: DC
  Administered 2023-09-20 – 2023-09-21 (×3): 500 mg via INTRAVENOUS
  Filled 2023-09-19 (×3): qty 100

## 2023-09-19 MED ORDER — DIPHENHYDRAMINE HCL 25 MG PO CAPS
25.0000 mg | ORAL_CAPSULE | Freq: Four times a day (QID) | ORAL | Status: DC | PRN
Start: 2023-09-19 — End: 2023-09-22
  Administered 2023-09-20: 25 mg via ORAL
  Filled 2023-09-19: qty 1

## 2023-09-19 MED ORDER — ONDANSETRON HCL 4 MG/2ML IJ SOLN
4.0000 mg | Freq: Once | INTRAMUSCULAR | Status: AC
  Administered 2023-09-19: 4 mg via INTRAVENOUS
  Filled 2023-09-19: qty 2

## 2023-09-19 MED ORDER — SENNOSIDES-DOCUSATE SODIUM 8.6-50 MG PO TABS
1.0000 | ORAL_TABLET | Freq: Every evening | ORAL | Status: DC | PRN
Start: 2023-09-19 — End: 2023-09-22

## 2023-09-19 MED ORDER — KCL IN DEXTROSE-NACL 20-5-0.45 MEQ/L-%-% IV SOLN
INTRAVENOUS | Status: AC
  Filled 2023-09-19 (×3): qty 1000

## 2023-09-19 MED ORDER — SODIUM CHLORIDE 0.9 % IV SOLN
2.0000 g | Freq: Once | INTRAVENOUS | Status: AC
  Administered 2023-09-19: 2 g via INTRAVENOUS
  Filled 2023-09-19: qty 20

## 2023-09-19 MED ORDER — ONDANSETRON HCL 4 MG/2ML IJ SOLN: 4.0000 mg | Freq: Four times a day (QID) | INTRAMUSCULAR | Status: DC | PRN

## 2023-09-19 MED ORDER — MORPHINE SULFATE (PF) 2 MG/ML IV SOLN
2.0000 mg | INTRAVENOUS | Status: DC | PRN
  Administered 2023-09-20 – 2023-09-21 (×2): 2 mg via INTRAVENOUS
  Filled 2023-09-19 (×2): qty 1

## 2023-09-19 MED ORDER — DIPHENHYDRAMINE HCL 50 MG/ML IJ SOLN
25.0000 mg | Freq: Four times a day (QID) | INTRAMUSCULAR | Status: DC | PRN
Start: 2023-09-19 — End: 2023-09-22

## 2023-09-19 MED ORDER — METRONIDAZOLE 500 MG/100ML IV SOLN
500.0000 mg | Freq: Once | INTRAVENOUS | Status: AC
  Administered 2023-09-19: 500 mg via INTRAVENOUS
  Filled 2023-09-19: qty 100

## 2023-09-19 MED ORDER — LACTATED RINGERS IV BOLUS
1000.0000 mL | Freq: Once | INTRAVENOUS | Status: AC
  Administered 2023-09-19: 1000 mL via INTRAVENOUS

## 2023-09-19 MED ORDER — IOHEXOL 300 MG/ML  SOLN
100.0000 mL | Freq: Once | INTRAMUSCULAR | Status: AC | PRN
  Administered 2023-09-19: 100 mL via INTRAVENOUS

## 2023-09-19 MED ORDER — ENOXAPARIN SODIUM 40 MG/0.4ML IJ SOSY
40.0000 mg | PREFILLED_SYRINGE | INTRAMUSCULAR | Status: DC
  Administered 2023-09-19 – 2023-09-21 (×3): 40 mg via SUBCUTANEOUS
  Filled 2023-09-19 (×3): qty 0.4

## 2023-09-19 NOTE — ED Triage Notes (Signed)
 Pt c/o lower abd pain with nausea and fever (100.1) since yesterday, Tylenol  and Prilosec at 1530, LBM today, burning with urination

## 2023-09-19 NOTE — ED Provider Notes (Cosign Needed Addendum)
  EMERGENCY DEPARTMENT AT [facility] Provider Note   CSN: [id number] Arrival date & time: 09/19/23  1716     Patient presents with: Abdominal Pain   Jonathan Cardenas is a 23 y.o. male with history of autism presents emerged from today for evaluation of lower abdominal pain since last night around 2100.  Reports that the pain is like sharp and stabbing.  Has only been in the lower abdomen.  Today around 1400 his mother took his temperature and was 100.3 and gave Tylenol  around this time.  He reports that he had a bowel movement shortly prior to arrival that was not painful but was not sure if it was black or bloody as he did not look.  Denies any diarrhea or constipation.  He denies burning with urination reports that it does feel sensitive.  He denies any pain or swelling to the penis, testicles, or scrotum.  He does report some bodyaches but no rhinorrhea, nasal congestion, or cough.  He has been nauseous but no vomiting.  Does report an increase in belching.  He only eats around 5 types of food and did not have anything different from his usual diet.  Previous history of cholecystectomy reports that this feels similar to the type of pain.  Abdominal Pain Associated symptoms: fever and nausea   Associated symptoms: no chest pain, no chills, no constipation, no diarrhea, no hematuria, no shortness of breath and no vomiting        Prior to Admission medications   Medication Sig Start Date End Date Taking? Authorizing Provider  ibuprofen  (ADVIL ) 800 MG tablet Take 1 tablet (800 mg total) by mouth every 8 (eight) hours as needed. 05/29/22   Kinsinger, Herlene Righter, MD  lisdexamfetamine (VYVANSE ) 50 MG capsule Take 50 mg by mouth daily.    [provider]  Melatonin 10 MG TABS Take 1 tablet by mouth at bedtime.    [provider]  ondansetron  (ZOFRAN ) 4 MG tablet Take 1 tablet (4 mg total) by mouth every 8 (eight) hours as needed for nausea or vomiting. Patient  taking differently: Take 4 mg by mouth daily. 04/01/15   Lenor Hollering, MD  oxyCODONE  (OXY IR/ROXICODONE ) 5 MG immediate release tablet Take 1 tablet (5 mg total) by mouth every 6 (six) hours as needed for severe pain. 05/29/22   Kinsinger, Herlene Righter, MD    Allergies: Penicillins    Review of Systems  Constitutional:  Positive for fever. Negative for chills.  HENT:  Negative for congestion and rhinorrhea.   Respiratory:  Negative for shortness of breath.   Cardiovascular:  Negative for chest pain.  Gastrointestinal:  Positive for abdominal pain and nausea. Negative for constipation, diarrhea and vomiting.  Genitourinary:  Negative for hematuria, penile discharge, penile pain, penile swelling, scrotal swelling and testicular pain.  Musculoskeletal:  Positive for myalgias.    Updated Vital Signs BP 127/86 (BP Location: Right Arm)   Pulse (!) 108   Temp 99.3 F (37.4 C) (Oral)   Resp 16   Ht 6' (1.829 m)   Wt 77.1 kg   SpO2 98%   BMI 23.06 kg/m   Physical Exam Vitals and nursing note reviewed.  Constitutional:      General: He is not in acute distress.    Appearance: He is not toxic-appearing.  Eyes:     General: No scleral icterus. Pulmonary:     Effort: Pulmonary effort is normal. No respiratory distress.  Abdominal:  General: There is no distension.     Palpations: Abdomen is soft.     Tenderness: There is abdominal tenderness in the right lower quadrant, suprapubic area and left lower quadrant. There is no guarding or rebound.     Comments: More tender to the right lower quadrant but denies diffuse lower abdominal tenderness palpation.  No guarding or rebound.  Soft.  Normal active bowel sounds.  Skin:    General: Skin is warm and dry.  Neurological:     Mental Status: He is alert.     (all labs ordered are listed, but only abnormal results are displayed) Labs Reviewed  CBC WITH DIFFERENTIAL/PLATELET - Abnormal; Notable for the following components:      Result  Value   WBC 11.6 (*)    Neutro Abs 8.4 (*)    Monocytes Absolute 1.7 (*)    All other components within normal limits  LACTIC ACID, PLASMA  COMPREHENSIVE METABOLIC PANEL WITH GFR  LACTIC ACID, PLASMA  URINALYSIS, ROUTINE W REFLEX MICROSCOPIC    EKG: None  Radiology: CT ABDOMEN PELVIS W CONTRAST Result Date: 09/19/2023 CLINICAL DATA:  Right lower quadrant pain EXAM: CT ABDOMEN AND PELVIS WITH CONTRAST TECHNIQUE: Multidetector CT imaging of the abdomen and pelvis was performed using the standard protocol following bolus administration of intravenous contrast. RADIATION DOSE REDUCTION: This exam was performed according to the departmental dose-optimization program which includes automated exposure control, adjustment of the mA and/or kV according to patient size and/or use of iterative reconstruction technique. CONTRAST:  OMNIPAQUE  IOHEXOL  300 MG/ML  SOLN COMPARISON:  CT 05/28/2022 FINDINGS: Lower chest: No acute abnormality. Hepatobiliary: No focal liver abnormality is seen. Status post cholecystectomy. No biliary dilatation. Pancreas: Unremarkable. No pancreatic ductal dilatation or surrounding inflammatory changes. Spleen: Normal in size without focal abnormality. Adrenals/Urinary Tract: Adrenal glands are unremarkable. Kidneys are normal, without renal calculi, focal lesion, or hydronephrosis. Bladder is unremarkable. Stomach/Bowel: Stomach is nonenlarged. There is no dilated small bowel. Abnormal appendix. Appendix is enlarged, measuring 11 mm. Considerable periappendiceal and right lower quadrant soft tissue stranding. No extraluminal gas. No rim enhancing collection to suggest an abscess. Appendix is retrocecal with ascending orientation. Vascular/Lymphatic: Nonaneurysmal aorta. Small right lower quadrant mesenteric nodes measuring up to 8 mm. Reproductive: Prostate is unremarkable. Other: No free air.  Small volume free fluid in the pelvis Musculoskeletal: No acute osseous abnormality  IMPRESSION: 1. Findings consistent with acute appendicitis. Considerable periappendiceal and right lower quadrant soft tissue stranding. No extraluminal gas to suggest perforation or rim enhancing collection to suggest an abscess. Multiple small right lower quadrant lymph nodes, likely reactive 2. Small volume free fluid in the pelvis. Electronically Signed   By: Luke Bun M.D.   On: 09/19/2023 18:57    Procedures   Medications Ordered in the ED  cefTRIAXone  (ROCEPHIN ) 2 g in sodium chloride  0.9 % 100 mL IVPB (0 g Intravenous Stopped 09/19/23 2002)    And  metroNIDAZOLE  (FLAGYL ) IVPB 500 mg (500 mg Intravenous New Bag/Given 09/19/23 2008)  enoxaparin  (LOVENOX ) injection 40 mg (has no administration in time range)  dextrose  5 % and 0.45 % NaCl with KCl 20 mEq/L infusion (has no administration in time range)  cefTRIAXone  (ROCEPHIN ) 2 g in sodium chloride  0.9 % 100 mL IVPB (has no administration in time range)    And  metroNIDAZOLE  (FLAGYL ) IVPB 500 mg (has no administration in time range)  morphine  (PF) 2 MG/ML injection 2 mg (has no administration in time range)  diphenhydrAMINE  (BENADRYL ) capsule  25 mg (has no administration in time range)    Or  diphenhydrAMINE  (BENADRYL ) injection 25 mg (has no administration in time range)  senna-docusate (Senokot-S) tablet 1 tablet (has no administration in time range)  ondansetron  (ZOFRAN -ODT) disintegrating tablet 4 mg (has no administration in time range)    Or  ondansetron  (ZOFRAN ) injection 4 mg (has no administration in time range)  lactated ringers  bolus 1,000 mL (0 mLs Intravenous Stopped 09/19/23 1900)  ondansetron  (ZOFRAN ) injection 4 mg (4 mg Intravenous Given 09/19/23 1800)  iohexol  (OMNIPAQUE ) 300 MG/ML solution 100 mL (100 mLs Intravenous Contrast Given 09/19/23 1826)  0.9 %  sodium chloride  infusion ( Intravenous New Bag/Given 09/19/23 1929)   Clinical Course as of 09/19/23 2054  Sat Sep 19, 2023  1915 Dr. Debby with surgery will admit  to [facility] for likely surgery tomorrow.  [RR]    Clinical Course User Index [RR] Bernis Ernst, PA-C     Medical Decision Making Amount and/or Complexity of Data Reviewed Labs: ordered. Radiology: ordered.  Risk Prescription drug management. Decision regarding hospitalization.   23 y.o. male presents to the ER for evaluation of lower abdominal pain. Differential diagnosis includes but is not limited to AAA, mesenteric ischemia, appendicitis, diverticulitis, DKA, gastroenteritis, nephrolithiasis, pancreatitis, constipation, UTI, bowel obstruction, biliary disease, IBD, PUD, hepatitis, testicular torsion. Vital signs mild tachycardia otherwise unremarkable. Physical exam as noted above.   I have ordered the patient some fluids and nausea medication.  CT abdominal exam and labs ordered as well.  Patient mildly tachycardic however may be due to some dehydration due to limited p.o. intake will add on lactic.  I independently reviewed and interpreted the patient's labs.  CBC shows white count 11.6 with a left shift.  No anemia.  CMP shows a total bili of 3.2, no other electrolyte or LFT abnormalities..  CT imaging shows  1. Findings consistent with acute appendicitis. Considerable periappendiceal and right lower quadrant soft tissue stranding. No extraluminal gas to suggest perforation or rim enhancing collection to suggest an abscess. Multiple small right lower quadrant lymph nodes, likely reactive 2. Small volume free fluid in the pelvis.  I have started the patient on antibiotics for appendicitis.  General surgery consulted and spoke with Dr. Debby.  She will admit the patient onto her service for [facility] for likely surgery for tomorrow.  I discussed the plan with patient and parent at bedside.  Patient still not having any pain or requesting pain medication.  Will continue with antibiotics.  Patient being transferred over to [facility].  Portions of this report may have been transcribed  using voice recognition software. Every effort was made to ensure accuracy; however, inadvertent computerized transcription errors may be present.    Final diagnoses:  Acute appendicitis, unspecified acute appendicitis type    ED Discharge Orders     None          Bernis Ernst, PA-C 09/19/23 2052    Bernis Ernst, PA-C 09/19/23 2054    Yolande Lamar BROCKS, MD 09/21/23 [phone]

## 2023-09-19 NOTE — ED Notes (Signed)
 Pt unable to provide UA. Aware a specimen is needed.

## 2023-09-19 NOTE — ED Notes (Signed)
 Patient transported to CT

## 2023-09-20 ENCOUNTER — Other Ambulatory Visit: Payer: Self-pay

## 2023-09-20 ENCOUNTER — Encounter (HOSPITAL_COMMUNITY)
Admission: EM | Disposition: A | Discharge status: Home / Self Care | Payer: Self-pay | Source: Home / Self Care | Attending: General Surgery

## 2023-09-20 ENCOUNTER — Observation Stay (HOSPITAL_COMMUNITY): Payer: MEDICAID | Admitting: Certified Registered Nurse Anesthetist

## 2023-09-20 ENCOUNTER — Observation Stay (HOSPITAL_BASED_OUTPATIENT_CLINIC_OR_DEPARTMENT_OTHER): Payer: MEDICAID | Admitting: Certified Registered Nurse Anesthetist

## 2023-09-20 DIAGNOSIS — K3580 Unspecified acute appendicitis: Secondary | ICD-10-CM

## 2023-09-20 HISTORY — PX: LAPAROSCOPIC APPENDECTOMY: SHX408

## 2023-09-20 LAB — BASIC METABOLIC PANEL WITH GFR
Anion gap: 10 (ref 5–15)
BUN: 7 mg/dL (ref 6–20)
CO2: 24 mmol/L (ref 22–32)
Calcium: 9 mg/dL (ref 8.9–10.3)
Chloride: 99 mmol/L (ref 98–111)
Creatinine, Ser: 0.68 mg/dL (ref 0.61–1.24)
GFR, Estimated: >60 mL/min (ref >60)
Glucose, Bld: 82 mg/dL (ref 70–99)
Potassium: 4.2 mmol/L (ref 3.5–5.1)
Sodium: 133 mmol/L — ABNORMAL LOW (ref 135–145)

## 2023-09-20 LAB — CBC
HCT: 44.1 % (ref 39.0–52.0)
Hemoglobin: 15.6 g/dL (ref 13.0–17.0)
MCH: 34.7 pg — ABNORMAL HIGH (ref 26.0–34.0)
MCHC: 35.4 g/dL (ref 30.0–36.0)
MCV: 98.2 fL (ref 80.0–100.0)
Platelets: 227 K/uL (ref 150–400)
RBC: 4.49 MIL/uL (ref 4.22–5.81)
RDW: 11.9 % (ref 11.5–15.5)
WBC: 9.1 K/uL (ref 4.0–10.5)
nRBC: 0 % (ref 0.0–0.2)

## 2023-09-20 LAB — HIV ANTIBODY (ROUTINE TESTING W REFLEX): HIV Screen 4th Generation wRfx: NONREACTIVE

## 2023-09-20 SURGERY — APPENDECTOMY, LAPAROSCOPIC
Anesthesia: General

## 2023-09-20 MED ORDER — 0.9 % SODIUM CHLORIDE (POUR BTL) OPTIME
TOPICAL | Status: DC | PRN
  Administered 2023-09-20: 1000 mL

## 2023-09-20 MED ORDER — BUPIVACAINE-EPINEPHRINE (PF) 0.25% -1:200000 IJ SOLN
INTRAMUSCULAR | Status: AC
  Filled 2023-09-20: qty 30

## 2023-09-20 MED ORDER — FENTANYL CITRATE (PF) 100 MCG/2ML IJ SOLN
INTRAMUSCULAR | Status: AC
Start: 2023-09-20 — End: 2023-09-20
  Filled 2023-09-20: qty 2

## 2023-09-20 MED ORDER — DEXAMETHASONE SODIUM PHOSPHATE 10 MG/ML IJ SOLN
INTRAMUSCULAR | Status: AC
  Filled 2023-09-20: qty 1

## 2023-09-20 MED ORDER — ONDANSETRON HCL 4 MG/2ML IJ SOLN
INTRAMUSCULAR | Status: AC
Start: 2023-09-20 — End: 2023-09-20
  Filled 2023-09-20: qty 2

## 2023-09-20 MED ORDER — LABETALOL HCL 5 MG/ML IV SOLN
INTRAVENOUS | Status: DC | PRN
  Administered 2023-09-20: 5 mg via INTRAVENOUS

## 2023-09-20 MED ORDER — OXYCODONE HCL 5 MG PO TABS: 5.0000 mg | ORAL_TABLET | Freq: Once | ORAL | Status: DC | PRN

## 2023-09-20 MED ORDER — SUGAMMADEX SODIUM 200 MG/2ML IV SOLN
INTRAVENOUS | Status: AC
  Filled 2023-09-20: qty 2

## 2023-09-20 MED ORDER — ONDANSETRON HCL 4 MG/2ML IJ SOLN: 4.0000 mg | Freq: Once | INTRAMUSCULAR | Status: DC | PRN

## 2023-09-20 MED ORDER — ROCURONIUM BROMIDE 10 MG/ML (PF) SYRINGE
PREFILLED_SYRINGE | INTRAVENOUS | Status: AC
  Filled 2023-09-20: qty 10

## 2023-09-20 MED ORDER — MIDAZOLAM HCL 5 MG/5ML IJ SOLN
INTRAMUSCULAR | Status: DC | PRN
Start: 2023-09-20 — End: 2023-09-20
  Administered 2023-09-20: 2 mg via INTRAVENOUS

## 2023-09-20 MED ORDER — ACETAMINOPHEN 10 MG/ML IV SOLN: 1000.0000 mg | Freq: Once | INTRAVENOUS | Status: DC | PRN

## 2023-09-20 MED ORDER — BUPIVACAINE-EPINEPHRINE (PF) 0.25% -1:200000 IJ SOLN
INTRAMUSCULAR | Status: DC | PRN
  Administered 2023-09-20: 20 mL

## 2023-09-20 MED ORDER — ONDANSETRON HCL 4 MG/2ML IJ SOLN
INTRAMUSCULAR | Status: DC | PRN
  Administered 2023-09-20: 4 mg via INTRAVENOUS

## 2023-09-20 MED ORDER — ROCURONIUM BROMIDE 10 MG/ML (PF) SYRINGE
PREFILLED_SYRINGE | INTRAVENOUS | Status: DC | PRN
Start: 2023-09-20 — End: 2023-09-20
  Administered 2023-09-20: 20 mg via INTRAVENOUS
  Administered 2023-09-20: 10 mg via INTRAVENOUS
  Administered 2023-09-20: 50 mg via INTRAVENOUS

## 2023-09-20 MED ORDER — LACTATED RINGERS IV SOLN: INTRAVENOUS | Status: DC | PRN

## 2023-09-20 MED ORDER — KETOROLAC TROMETHAMINE 30 MG/ML IJ SOLN
INTRAMUSCULAR | Status: AC
Start: 2023-09-20 — End: 2023-09-20
  Filled 2023-09-20: qty 1

## 2023-09-20 MED ORDER — FENTANYL CITRATE PF 50 MCG/ML IJ SOSY: 25.0000 ug | PREFILLED_SYRINGE | INTRAMUSCULAR | Status: DC | PRN

## 2023-09-20 MED ORDER — KETOROLAC TROMETHAMINE 30 MG/ML IJ SOLN
INTRAMUSCULAR | Status: AC
  Filled 2023-09-20: qty 1

## 2023-09-20 MED ORDER — MIDAZOLAM HCL 2 MG/2ML IJ SOLN
INTRAMUSCULAR | Status: AC
  Filled 2023-09-20: qty 2

## 2023-09-20 MED ORDER — HYDROMORPHONE HCL 2 MG/ML IJ SOLN
INTRAMUSCULAR | Status: AC
  Filled 2023-09-20: qty 1

## 2023-09-20 MED ORDER — DEXAMETHASONE SODIUM PHOSPHATE 4 MG/ML IJ SOLN
INTRAMUSCULAR | Status: DC | PRN
Start: 2023-09-20 — End: 2023-09-20
  Administered 2023-09-20: 5 mg via INTRAVENOUS

## 2023-09-20 MED ORDER — OXYCODONE HCL 5 MG/5ML PO SOLN: 5.0000 mg | Freq: Once | ORAL | Status: DC | PRN

## 2023-09-20 MED ORDER — OXYCODONE HCL 5 MG PO TABS
5.0000 mg | ORAL_TABLET | ORAL | Status: DC | PRN
  Administered 2023-09-20 – 2023-09-21 (×2): 5 mg via ORAL
  Filled 2023-09-20 (×2): qty 1

## 2023-09-20 MED ORDER — LACTATED RINGERS IR SOLN
Status: DC | PRN
  Administered 2023-09-20: 1000 mL

## 2023-09-20 MED ORDER — DEXMEDETOMIDINE HCL IN NACL 80 MCG/20ML IV SOLN
INTRAVENOUS | Status: AC
  Filled 2023-09-20: qty 20

## 2023-09-20 MED ORDER — SUGAMMADEX SODIUM 200 MG/2ML IV SOLN
INTRAVENOUS | Status: DC | PRN
  Administered 2023-09-20: 200 mg via INTRAVENOUS

## 2023-09-20 MED ORDER — PROPOFOL 10 MG/ML IV BOLUS
INTRAVENOUS | Status: DC | PRN
  Administered 2023-09-20: 150 mg via INTRAVENOUS

## 2023-09-20 MED ORDER — HYDROMORPHONE HCL 1 MG/ML IJ SOLN
INTRAMUSCULAR | Status: DC | PRN
  Administered 2023-09-20 (×2): 1 mg via INTRAVENOUS

## 2023-09-20 MED ORDER — DEXMEDETOMIDINE HCL IN NACL 80 MCG/20ML IV SOLN
INTRAVENOUS | Status: DC | PRN
  Administered 2023-09-20: 8 ug via INTRAVENOUS

## 2023-09-20 MED ORDER — FENTANYL CITRATE (PF) 100 MCG/2ML IJ SOLN
INTRAMUSCULAR | Status: DC | PRN
  Administered 2023-09-20 (×2): 100 ug via INTRAVENOUS
  Administered 2023-09-20: 50 ug via INTRAVENOUS

## 2023-09-20 MED ORDER — LIDOCAINE HCL (PF) 2 % IJ SOLN
INTRAMUSCULAR | Status: AC
Start: 2023-09-20 — End: 2023-09-20
  Filled 2023-09-20: qty 5

## 2023-09-20 MED ORDER — FENTANYL CITRATE (PF) 100 MCG/2ML IJ SOLN
INTRAMUSCULAR | Status: AC
  Filled 2023-09-20: qty 2

## 2023-09-20 MED ORDER — CHLORHEXIDINE GLUCONATE CLOTH 2 % EX PADS: 6.0000 | MEDICATED_PAD | Freq: Every day | CUTANEOUS | Status: DC

## 2023-09-20 MED ORDER — LABETALOL HCL 5 MG/ML IV SOLN
INTRAVENOUS | Status: AC
  Filled 2023-09-20: qty 4

## 2023-09-20 MED ORDER — FENTANYL CITRATE PF 50 MCG/ML IJ SOSY
PREFILLED_SYRINGE | INTRAMUSCULAR | Status: AC
  Filled 2023-09-20: qty 2

## 2023-09-20 MED ORDER — CHLORHEXIDINE GLUCONATE CLOTH 2 % EX PADS
6.0000 | MEDICATED_PAD | Freq: Every day | CUTANEOUS | Status: DC
  Administered 2023-09-21 – 2023-09-22 (×2): 6 via TOPICAL

## 2023-09-20 SURGICAL SUPPLY — 39 items
BAG COUNTER SPONGE SURGICOUNT (BAG) IMPLANT
BENZOIN TINCTURE PRP APPL 2/3 (GAUZE/BANDAGES/DRESSINGS) IMPLANT
CABLE HIGH FREQUENCY MONO STRZ (ELECTRODE) IMPLANT
CLIP APPLIE 5 13 M/L LIGAMAX5 (MISCELLANEOUS) IMPLANT
CLIP APPLIE ROT 10 11.4 M/L (STAPLE) IMPLANT
COVER SURGICAL LIGHT HANDLE (MISCELLANEOUS) ×1 IMPLANT
CUTTER FLEX LINEAR 45M (STAPLE) IMPLANT
DRAIN CHANNEL 19F RND (DRAIN) IMPLANT
DRSG TEGADERM 2-3/8X2-3/4 SM (GAUZE/BANDAGES/DRESSINGS) IMPLANT
ELECT REM PT RETURN 15FT ADLT (MISCELLANEOUS) ×1 IMPLANT
EVACUATOR SILICONE 100CC (DRAIN) IMPLANT
GAUZE SPONGE 2X2 8PLY STRL LF (GAUZE/BANDAGES/DRESSINGS) IMPLANT
GLOVE BIO SURGEON STRL SZ7 (GLOVE) ×1 IMPLANT
GLOVE BIOGEL PI IND STRL 7.0 (GLOVE) ×1 IMPLANT
GLOVE BIOGEL PI IND STRL 7.5 (GLOVE) ×1 IMPLANT
GOWN STRL REUS W/ TWL LRG LVL3 (GOWN DISPOSABLE) ×2 IMPLANT
IRRIGATION SUCT STRKRFLW 2 WTP (MISCELLANEOUS) ×1 IMPLANT
KIT BASIN OR (CUSTOM PROCEDURE TRAY) ×1 IMPLANT
KIT TURNOVER KIT A (KITS) ×1 IMPLANT
NS IRRIG 1000ML POUR BTL (IV SOLUTION) ×1 IMPLANT
PENCIL SMOKE EVACUATOR (MISCELLANEOUS) IMPLANT
RELOAD STAPLE 45 2.5 WHT GRN (ENDOMECHANICALS) IMPLANT
RELOAD STAPLE 45 3.5 BLU ETS (ENDOMECHANICALS) IMPLANT
SCISSORS LAP 5X35 DISP (ENDOMECHANICALS) ×1 IMPLANT
SET TUBE SMOKE EVAC HIGH FLOW (TUBING) ×1 IMPLANT
SHEARS HARMONIC 36 ACE (MISCELLANEOUS) IMPLANT
SLEEVE ADV FIXATION 5X100MM (TROCAR) IMPLANT
SLEEVE Z-THREAD 5X100MM (TROCAR) ×1 IMPLANT
SPIKE FLUID TRANSFER (MISCELLANEOUS) ×1 IMPLANT
STRIP CLOSURE SKIN 1/2X4 (GAUZE/BANDAGES/DRESSINGS) ×1 IMPLANT
SUT ETHILON 2 0 PS N (SUTURE) IMPLANT
SUT MNCRL AB 4-0 PS2 18 (SUTURE) ×1 IMPLANT
SUT VICRYL 0 ENDOLOOP (SUTURE) IMPLANT
SYSTEM BAG RETRIEVAL 10MM (BASKET) ×1 IMPLANT
TOWEL OR 17X26 10 PK STRL BLUE (TOWEL DISPOSABLE) ×1 IMPLANT
TRAY FOLEY MTR SLVR 16FR STAT (SET/KITS/TRAYS/PACK) IMPLANT
TRAY LAPAROSCOPIC (CUSTOM PROCEDURE TRAY) ×1 IMPLANT
TROCAR BALLN 12MMX100 BLUNT (TROCAR) ×1 IMPLANT
TROCAR Z-THREAD OPTICAL 5X100M (TROCAR) ×1 IMPLANT

## 2023-09-20 NOTE — Plan of Care (Signed)
   Problem: Clinical Measurements: Goal: Will remain free from infection Outcome: Progressing Goal: Diagnostic test results will improve Outcome: Progressing

## 2023-09-20 NOTE — Anesthesia Postprocedure Evaluation (Signed)
 Anesthesia Post Note  Patient: Jonathan Cardenas  Procedure(s) Performed: APPENDECTOMY, LAPAROSCOPIC     Patient location during evaluation: PACU Anesthesia Type: General Level of consciousness: awake and alert Pain management: pain level controlled Vital Signs Assessment: post-procedure vital signs reviewed and stable Respiratory status: spontaneous breathing, nonlabored ventilation, respiratory function stable and patient connected to nasal cannula oxygen Cardiovascular status: blood pressure returned to baseline and stable Postop Assessment: no apparent nausea or vomiting Anesthetic complications: no   No notable events documented.  Last Vitals:  Vitals:   09/20/23 1300 09/20/23 1317  BP: 138/72 134/75  Pulse: (!) 111 (!) 113  Resp: (!) 8 (!) 9  Temp:  (!) 36.4 C  SpO2: 95% 96%    Last Pain:  Vitals:   09/20/23 1317  TempSrc:   PainSc: 0-No pain                 Lynwood MARLA Cornea

## 2023-09-20 NOTE — Op Note (Signed)
 Appendectomy, Lap, Procedure Note  Indications: The patient presented with a 2 day history of right-sided abdominal pain. A CT revealed findings consistent with acute appendicitis without obvious perforation or abscess  Pre-operative Diagnosis: Acute appendicitis without mention of peritonitis  Post-operative Diagnosis: Acute appendicitis without mention of peritonitis  Surgeon: Jonathan Cardenas   Assistants: none  Anesthesia: General endotracheal anesthesia  ASA Class: 2  Procedure Details  The patient was seen again in the Holding Room. The risks, benefits, complications, treatment options, and expected outcomes were discussed with the patient and/or family. The possibilities of reaction to medication, perforation of viscus, bleeding, recurrent infection, finding a normal appendix, the need for additional procedures, failure to diagnose a condition, and creating a complication requiring transfusion or operation were discussed. There was concurrence with the proposed plan and informed consent was obtained. The site of surgery was properly noted. The patient was taken to Operating Room, identified as Jonathan Cardenas and the procedure verified as Appendectomy. A Time Out was held and the above information confirmed.  The patient was placed in the supine position and general anesthesia was induced.  The abdomen was prepped and draped in a sterile fashion. A one centimeter supraumbilical incision was made, using his previous laparoscopic scar.  Dissection was carried down to the fascia bluntly.  The fascia was incised vertically.  We entered the peritoneal cavity bluntly.  A pursestring suture was passed around the fascial opening with a 0 Vicryl.  The Hasson cannula was introduced into the abdomen and the tails of the suture were used to hold the Hasson in place.   The pneumoperitoneum was then established maintaining a maximum pressure of 15 mmHg.  Additional 5 mm cannulas then placed in the left  lower quadrant of the abdomen and the epigastrium under direct visualization. A careful evaluation of the entire abdomen was carried out. The patient was placed in Trendelenburg and left lateral decubitus position.  The scope was moved to the right upper quadrant port site. The cecum was mobilized medially.  The terminal ileum is identified and appears normal.  The cecum is quite mobile.  There was significant thickening and omental adhesions to the right paracolic gutter and in the retrocecal space.  We resected a small section of omentum with the harmonic scalp.  An additional RUQ 5 mm port was placed to aid in the difficult exposure.  The appendix is quite thickened and densely adherent to the posterior wall of the right colon.  The appendix was carefully dissected. The appendix was skeletonized with the harmonic scalpel.   The appendix was divided at what appears to be its base using an endo-GIA stapler. The anatomy is quite difficult to visualize due to the amount of surrounding inflammation and thickening.  There was no evidence of bleeding, leakage, or complication after division of the appendix. I placed a drain into the retrocecal region, exiting through the RUQ port site.  The drain was secured with 2-0 Ethilon.  Irrigation was also performed and irrigate suctioned from the abdomen as well.  The umbilical port site was closed with the purse string suture. There was no residual palpable fascial defect.  The trocar site skin wounds were closed with 4-0 Monocryl.  Instrument, sponge, and needle counts were correct at the conclusion of the case.   Findings: The appendix was found to be inflamed. There were not signs of necrosis.  There was not perforation. There was not abscess formation.  Estimated Blood Loss:  Minimal  Drains: 19 Fr JP drain         Specimens: appendix with adjacent omentum         Complications:  None; patient tolerated the procedure well.         Disposition: PACU -  hemodynamically stable.         Condition: stable  Jonathan Cardenas. Belinda, MD, [facility] Surgery  General Surgery   09/20/2023 11:56 AM

## 2023-09-20 NOTE — Transfer of Care (Signed)
 Immediate Anesthesia Transfer of Care Note  Patient: XZAYVION VAETH  Procedure(s) Performed: APPENDECTOMY, LAPAROSCOPIC  Patient Location: PACU  Anesthesia Type:General  Level of Consciousness: drowsy  Airway & Oxygen Therapy: Patient Spontanous Breathing and Patient connected to face mask  Post-op Assessment: Report given to RN and Post -op Vital signs reviewed and stable  Post vital signs: Reviewed and stable  Last Vitals:  Vitals Value Taken Time  BP 142/88 09/20/23 12:00  Temp    Pulse 106 09/20/23 12:01  Resp 12 09/20/23 12:01  SpO2 100 % 09/20/23 12:01  Vitals shown include unfiled device data.  Last Pain:  Vitals:   09/20/23 0850  TempSrc:   PainSc: 3          Complications: No notable events documented.

## 2023-09-20 NOTE — Anesthesia Procedure Notes (Signed)
 Procedure Name: Intubation Date/Time: 09/20/2023 9:55 AM  Performed by: Judythe Tanda Aran, CRNAPre-anesthesia Checklist: Patient identified, Emergency Drugs available, Suction available and Patient being monitored Patient Re-evaluated:Patient Re-evaluated prior to induction Oxygen Delivery Method: Circle system utilized Preoxygenation: Pre-oxygenation with 100% oxygen Induction Type: IV induction Ventilation: Mask ventilation without difficulty Laryngoscope Size: Mac and 4 Grade View: Grade II Tube type: Oral Tube size: 7.5 mm Number of attempts: 1 Airway Equipment and Method: Stylet Placement Confirmation: ETT inserted through vocal cords under direct vision, positive ETCO2 and breath sounds checked- equal and bilateral Secured at: 23 cm Tube secured with: Tape Dental Injury: Teeth and Oropharynx as per pre-operative assessment

## 2023-09-20 NOTE — Progress Notes (Addendum)
 Patient has not voided since he came up from surgery around 1400, he states he feels the urge to void and has some pain but is unable to void. Bladder scanner showed 407 mls of urine. Patient has PRN I&O cath ordered. Patient and mother were explained the procedure. Attempted to do I&O with a 16 fr catheter and had no urine return. Patient and mother started to cry because procedure took longer than explained and we attempted to calm them down. Mother asked why no urine came out, I told I felt some pressure and didn't feel it was safe to advance tube more. We asked if it was ok for another RN to attempt. If unable to I&O the second time MD will be notified.

## 2023-09-20 NOTE — H&P (Signed)
 Jonathan Cardenas is an 23 y.o. male.   Chief Complaint: RLQ abdominal pain HPI: This is a 23 year old male with autism who is s/p laparoscopic cholecystectomy 05/29/22 Dr. Stevie.  He presented to [facility] with 1.5 days of periumbilical pain that has migrated to the RLQ.  Low-grade temperature.  Nausea, but no vomiting.  CT scan revealed appendicitis without sign of perforation.  He was transferred to [facility] for evaluation and treatment.  Past Medical History:  Diagnosis Date   Attention and concentration deficit    Autism     Past Surgical History:  Procedure Laterality Date   CHOLECYSTECTOMY N/A 05/29/2022   Procedure: LAPAROSCOPIC CHOLECYSTECTOMY;  Surgeon: Kinsinger, Herlene Righter, MD;  Location: [facility];  Service: General;  Laterality: N/A;   TONSILLECTOMY      History reviewed. No pertinent family history. Social History:  reports that he has never smoked. He has never used smokeless tobacco. He reports that he does not drink alcohol and does not use drugs.  Allergies:  Allergies  Allergen Reactions   Penicillins Hives    Tolerated cephalosporin    Medications Prior to Admission  Medication Sig Dispense Refill   ibuprofen  (ADVIL ) 800 MG tablet Take 1 tablet (800 mg total) by mouth every 8 (eight) hours as needed. 20 tablet 0   lisdexamfetamine (VYVANSE ) 50 MG capsule Take 50 mg by mouth daily.     Melatonin 10 MG TABS Take 1 tablet by mouth at bedtime.     ondansetron  (ZOFRAN ) 4 MG tablet Take 1 tablet (4 mg total) by mouth every 8 (eight) hours as needed for nausea or vomiting. (Patient taking differently: Take 4 mg by mouth daily.) 5 tablet 0   oxyCODONE  (OXY IR/ROXICODONE ) 5 MG immediate release tablet Take 1 tablet (5 mg total) by mouth every 6 (six) hours as needed for severe pain. 10 tablet 0    Results for orders placed or performed during the hospital encounter of 09/19/23 (from the past 48 hours)  CBC with Differential     Status: Abnormal   Collection Time: 09/19/23  5:31 PM   Result Value Ref Range   WBC 11.6 (H) 4.0 - 10.5 K/uL   RBC 4.89 4.22 - 5.81 MIL/uL   Hemoglobin 16.4 13.0 - 17.0 g/dL   HCT 53.8 60.9 - 47.9 %   MCV 94.3 80.0 - 100.0 fL   MCH 33.5 26.0 - 34.0 pg   MCHC 35.6 30.0 - 36.0 g/dL   RDW 88.1 88.4 - 84.4 %   Platelets 267 150 - 400 K/uL   nRBC 0.0 0.0 - 0.2 %   Neutrophils Relative % 72 %   Neutro Abs 8.4 (H) 1.7 - 7.7 K/uL   Lymphocytes Relative 11 %   Lymphs Abs 1.3 0.7 - 4.0 K/uL   Monocytes Relative 15 %   Monocytes Absolute 1.7 (H) 0.1 - 1.0 K/uL   Eosinophils Relative 2 %   Eosinophils Absolute 0.2 0.0 - 0.5 K/uL   Basophils Relative 0 %   Basophils Absolute 0.0 0.0 - 0.1 K/uL   Immature Granulocytes 0 %   Abs Immature Granulocytes 0.04 0.00 - 0.07 K/uL    Comment: Performed at [facility], [address] [place], [place] [address]  Comprehensive metabolic panel     Status: Abnormal   Collection Time: 09/19/23  5:31 PM  Result Value Ref Range   Sodium 136 135 - 145 mmol/L   Potassium 3.8 3.5 - 5.1 mmol/L  Chloride 99 98 - 111 mmol/L   CO2 24 22 - 32 mmol/L   Glucose, Bld 92 70 - 99 mg/dL    Comment: Glucose reference range applies only to samples taken after fasting for at least 8 hours.   BUN 7 6 - 20 mg/dL   Creatinine, Ser 9.11 0.61 - 1.24 mg/dL   Calcium 9.7 8.9 - 89.6 mg/dL   Total Protein 7.6 6.5 - 8.1 g/dL   Albumin 4.6 3.5 - 5.0 g/dL   AST 22 15 - 41 U/L   ALT 20 0 - 44 U/L   Alkaline Phosphatase 70 38 - 126 U/L   Total Bilirubin 3.2 (H) 0.0 - 1.2 mg/dL   GFR, Estimated >39 >39 mL/min    Comment: (NOTE) Calculated using the CKD-EPI Creatinine Equation (2021)    Anion gap 14 5 - 15    Comment: Performed at [facility], [address] [place], [place] [address]  Lactic acid, plasma     Status: None   Collection Time: 09/19/23  5:31 PM  Result Value Ref Range   Lactic Acid, Venous 0.8 0.5 - 1.9 mmol/L    Comment: Performed at [facility], [address] [place], [place] [address]  Lipase, blood     Status: None   Collection Time: 09/19/23  5:31 PM  Result Value Ref Range   Lipase 18 11 - 51 U/L    Comment: Performed at [facility], [address] [place], [place] [address]  Urinalysis, Routine w reflex microscopic -Urine, Clean Catch     Status: Abnormal   Collection Time: 09/19/23  7:15 PM  Result Value Ref Range   Color, Urine YELLOW YELLOW   APPearance CLEAR CLEAR   Specific Gravity, Urine 1.010 1.005 - 1.030   pH 8.0 5.0 - 8.0   Glucose, UA NEGATIVE NEGATIVE mg/dL   Hgb urine dipstick TRACE (A) NEGATIVE   Bilirubin Urine NEGATIVE NEGATIVE   Ketones, ur NEGATIVE NEGATIVE mg/dL   Protein, ur NEGATIVE NEGATIVE mg/dL   Nitrite NEGATIVE NEGATIVE   Leukocytes,Ua NEGATIVE NEGATIVE    Comment: Performed at [facility], [address] [place], [place] [address]  Urinalysis, Microscopic (reflex)     Status: Abnormal   Collection Time: 09/19/23  7:15 PM  Result Value Ref Range   RBC / HPF 0-5 0 - 5 RBC/hpf   WBC, UA NONE SEEN 0 - 5 WBC/hpf   Bacteria, UA RARE (A) NONE SEEN   Squamous Epithelial / HPF NONE SEEN 0 - 5 /HPF    Comment: Performed at [facility], [address] [place], [place] [address]  Lactic acid, plasma     Status: None   Collection Time: 09/19/23  7:30 PM  Result Value Ref Range   Lactic Acid, Venous 1.1 0.5 - 1.9 mmol/L    Comment: Performed at [facility], [address] [place], [place] [address]  MRSA Next Gen by PCR, Nasal     Status: None   Collection Time: 09/19/23  9:03 PM   Specimen: Nasal Mucosa; Nasal Swab  Result Value Ref Range   MRSA by PCR Next Gen NOT DETECTED NOT DETECTED    Comment: (NOTE) The GeneXpert MRSA Assay (FDA approved for NASAL specimens only), is one component of a comprehensive MRSA colonization surveillance program. It is not intended to diagnose MRSA infection nor to guide or monitor treatment for MRSA  infections. Test performance is not  FDA approved in patients less than 49 years old. Performed at [facility], [address], [place], [place] [address]   Basic metabolic panel     Status: Abnormal   Collection Time: 09/20/23  4:43 AM  Result Value Ref Range   Sodium 133 (L) 135 - 145 mmol/L   Potassium 4.2 3.5 - 5.1 mmol/L   Chloride 99 98 - 111 mmol/L   CO2 24 22 - 32 mmol/L   Glucose, Bld 82 70 - 99 mg/dL    Comment: Glucose reference range applies only to samples taken after fasting for at least 8 hours.   BUN 7 6 - 20 mg/dL   Creatinine, Ser 9.31 0.61 - 1.24 mg/dL   Calcium 9.0 8.9 - 89.6 mg/dL   GFR, Estimated >39 >39 mL/min    Comment: (NOTE) Calculated using the CKD-EPI Creatinine Equation (2021)    Anion gap 10 5 - 15    Comment: Performed at [facility], [address], [place], [place] [address]  CBC     Status: Abnormal   Collection Time: 09/20/23  4:43 AM  Result Value Ref Range   WBC 9.1 4.0 - 10.5 K/uL   RBC 4.49 4.22 - 5.81 MIL/uL   Hemoglobin 15.6 13.0 - 17.0 g/dL   HCT 55.8 60.9 - 47.9 %   MCV 98.2 80.0 - 100.0 fL   MCH 34.7 (H) 26.0 - 34.0 pg   MCHC 35.4 30.0 - 36.0 g/dL   RDW 88.0 88.4 - 84.4 %   Platelets 227 150 - 400 K/uL   nRBC 0.0 0.0 - 0.2 %    Comment: Performed at [facility], [address] [place], [place] [address]   CT ABDOMEN PELVIS W CONTRAST Result Date: 09/19/2023 CLINICAL DATA:  Right lower quadrant pain EXAM: CT ABDOMEN AND PELVIS WITH CONTRAST TECHNIQUE: Multidetector CT imaging of the abdomen and pelvis was performed using the standard protocol following bolus administration of intravenous contrast. RADIATION DOSE REDUCTION: This exam was performed according to the departmental dose-optimization program which includes automated exposure control, adjustment of the mA and/or kV according to patient size and/or use of iterative reconstruction technique. CONTRAST:  OMNIPAQUE  IOHEXOL  300 MG/ML  SOLN COMPARISON:   CT 05/28/2022 FINDINGS: Lower chest: No acute abnormality. Hepatobiliary: No focal liver abnormality is seen. Status post cholecystectomy. No biliary dilatation. Pancreas: Unremarkable. No pancreatic ductal dilatation or surrounding inflammatory changes. Spleen: Normal in size without focal abnormality. Adrenals/Urinary Tract: Adrenal glands are unremarkable. Kidneys are normal, without renal calculi, focal lesion, or hydronephrosis. Bladder is unremarkable. Stomach/Bowel: Stomach is nonenlarged. There is no dilated small bowel. Abnormal appendix. Appendix is enlarged, measuring 11 mm. Considerable periappendiceal and right lower quadrant soft tissue stranding. No extraluminal gas. No rim enhancing collection to suggest an abscess. Appendix is retrocecal with ascending orientation. Vascular/Lymphatic: Nonaneurysmal aorta. Small right lower quadrant mesenteric nodes measuring up to 8 mm. Reproductive: Prostate is unremarkable. Other: No free air.  Small volume free fluid in the pelvis Musculoskeletal: No acute osseous abnormality IMPRESSION: 1. Findings consistent with acute appendicitis. Considerable periappendiceal and right lower quadrant soft tissue stranding. No extraluminal gas to suggest perforation or rim enhancing collection to suggest an abscess. Multiple small right lower quadrant lymph nodes, likely reactive 2. Small volume free fluid in the pelvis. Electronically Signed   By: Luke Bun M.D.   On: 09/19/2023 18:57    Review of Systems  Constitutional:  Positive  for fever.  HENT:  Negative for ear discharge, ear pain, hearing loss and tinnitus.   Eyes:  Negative for photophobia and pain.  Respiratory:  Negative for cough and shortness of breath.   Cardiovascular:  Negative for chest pain.  Gastrointestinal:  Positive for abdominal pain and nausea. Negative for vomiting.  Genitourinary:  Negative for dysuria, flank pain, frequency and urgency.  Musculoskeletal:  Negative for back pain,  myalgias and neck pain.  Neurological:  Negative for dizziness and headaches.  Hematological:  Does not bruise/bleed easily.  Psychiatric/Behavioral:  The patient is not nervous/anxious.     Blood pressure 120/77, pulse 95, temperature 99.7 F (37.6 C), resp. rate 16, height 6' (1.829 m), weight 77.1 kg, SpO2 98%. Physical Exam  Constitutional:  WDWN in NAD, conversant, no obvious deformities; lying in bed comfortably Eyes:  Pupils equal, round; sclera anicteric; moist conjunctiva; no lid lag HENT:  Oral mucosa moist; good dentition  Neck:  No masses palpated, trachea midline; no thyromegaly Lungs:  CTA bilaterally; normal respiratory effort CV:  Regular rate and rhythm; no murmurs; extremities well-perfused with no edema Abd:  +bowel sounds, soft, tender in RLQ, no palpable organomegaly; no palpable hernias Musc:  Unable to assess gait; no apparent clubbing or cyanosis in extremities Lymphatic:  No palpable cervical or axillary lymphadenopathy Skin:  Warm, dry; no sign of jaundice Psychiatric - alert and oriented x 4; calm mood and affect  Assessment/Plan Acute appendicitis  Recommend laparoscopic appendectomy.  The surgical procedure has been discussed with the patient and his mother.  Potential risks, benefits, alternative treatments, and expected outcomes have been explained.  All of the patient's questions at this time have been answered.  The likelihood of reaching the patient's treatment goal is good.  The patient understand the proposed surgical procedure and wishes to proceed.    Donnice MARLA Lima, MD 09/20/2023, 7:37 AM

## 2023-09-20 NOTE — Progress Notes (Addendum)
 MD Tsuei was notified that patient hasn't voided since surgery 1400 per the time we he arrived from PACU. He was bladder scanned by day shift RN. Patient was bladder scanned earlier showing 407 mls of urine. Patient states he is having pain on her lower abdomen and he has some distention. He was I&O per protocol if greater than 300 mls, 700 mls of urine was drained. MD Tsuei placed orders for indwelling catheter to be placed if patient doesn't void on his own later. Patient and his mother at the bedside were notified.

## 2023-09-20 NOTE — Anesthesia Preprocedure Evaluation (Signed)
 Anesthesia Evaluation  Patient identified by MRN, date of birth, ID band Patient awake    Reviewed: Allergy & Precautions, NPO status , Patient's Chart, lab work & pertinent test results, reviewed documented beta blocker date and time   History of Anesthesia Complications Negative for: history of anesthetic complications  Airway Mallampati: III  TM Distance: >3 FB     Dental no notable dental hx.    Pulmonary neg COPD   breath sounds clear to auscultation       Cardiovascular (-) angina (-) CAD, (-) Past MI and (-) Cardiac Stents  Rhythm:Regular Rate:Tachycardia     Neuro/Psych neg Seizures PSYCHIATRIC DISORDERS         GI/Hepatic ,neg GERD  ,,(+) neg Cirrhosis        Endo/Other    Renal/GU Renal disease     Musculoskeletal   Abdominal   Peds  Hematology   Anesthesia Other Findings   Reproductive/Obstetrics                              Anesthesia Physical Anesthesia Plan  ASA: 2  Anesthesia Plan: General   Post-op Pain Management:    Induction: Intravenous  PONV Risk Score and Plan: 2 and Ondansetron  and Dexamethasone   Airway Management Planned: Oral ETT  Additional Equipment:   Intra-op Plan:   Post-operative Plan: Extubation in OR  Informed Consent: I have reviewed the patients History and Physical, chart, labs and discussed the procedure including the risks, benefits and alternatives for the proposed anesthesia with the patient or authorized representative who has indicated his/her understanding and acceptance.     Dental advisory given  Plan Discussed with: CRNA  Anesthesia Plan Comments:          Anesthesia Quick Evaluation

## 2023-09-21 ENCOUNTER — Encounter (HOSPITAL_COMMUNITY): Payer: Self-pay | Admitting: Surgery

## 2023-09-21 LAB — BASIC METABOLIC PANEL WITH GFR
Anion gap: 12 (ref 5–15)
BUN: 11 mg/dL (ref 6–20)
CO2: 24 mmol/L (ref 22–32)
Calcium: 9.1 mg/dL (ref 8.9–10.3)
Chloride: 96 mmol/L — ABNORMAL LOW (ref 98–111)
Creatinine, Ser: 0.77 mg/dL (ref 0.61–1.24)
GFR, Estimated: >60 mL/min (ref >60)
Glucose, Bld: 111 mg/dL — ABNORMAL HIGH (ref 70–99)
Potassium: 3.9 mmol/L (ref 3.5–5.1)
Sodium: 132 mmol/L — ABNORMAL LOW (ref 135–145)

## 2023-09-21 LAB — CBC
HCT: 42.9 % (ref 39.0–52.0)
Hemoglobin: 14.4 g/dL (ref 13.0–17.0)
MCH: 32.9 pg (ref 26.0–34.0)
MCHC: 33.6 g/dL (ref 30.0–36.0)
MCV: 97.9 fL (ref 80.0–100.0)
Platelets: 272 K/uL (ref 150–400)
RBC: 4.38 MIL/uL (ref 4.22–5.81)
RDW: 11.8 % (ref 11.5–15.5)
WBC: 13.4 K/uL — ABNORMAL HIGH (ref 4.0–10.5)
nRBC: 0 % (ref 0.0–0.2)

## 2023-09-21 MED ORDER — SERTRALINE HCL 50 MG PO TABS
50.0000 mg | ORAL_TABLET | Freq: Every day | ORAL | Status: DC
  Administered 2023-09-21 – 2023-09-22 (×2): 50 mg via ORAL
  Filled 2023-09-21 (×2): qty 1

## 2023-09-21 MED ORDER — LISDEXAMFETAMINE DIMESYLATE 30 MG PO CAPS: 60.0000 mg | ORAL_CAPSULE | Freq: Every day | ORAL | Status: DC

## 2023-09-21 MED ORDER — LISDEXAMFETAMINE DIMESYLATE 30 MG PO CAPS
60.0000 mg | ORAL_CAPSULE | ORAL | Status: DC
  Administered 2023-09-22: 60 mg via ORAL
  Filled 2023-09-21 (×2): qty 2

## 2023-09-21 NOTE — Progress Notes (Addendum)
   09/21/23 1208  TOC Brief Assessment  Insurance and Status Reviewed  Patient has primary care physician Yes  Home environment has been reviewed resides in a private residence  Prior level of function: Independent  Prior/Current Home Services No current home services  Social Drivers of Health Review SDOH reviewed no interventions necessary  Readmission risk has been reviewed Yes  Transition of care needs no transition of care needs at this time

## 2023-09-21 NOTE — Discharge Instructions (Signed)

## 2023-09-21 NOTE — Plan of Care (Signed)
   Problem: Coping: Goal: Level of anxiety will decrease Outcome: Progressing   Problem: Pain Managment: Goal: General experience of comfort will improve and/or be controlled Outcome: Progressing

## 2023-09-21 NOTE — Progress Notes (Signed)
 1 Day Post-Op  Subjective: Feels well today.  Pain is a 4.  Ambulating well.  Voiding well after some retention last night.  No nausea.  Tolerating CLD.  + flatus, no BM  ROS: See above, otherwise other systems negative  Objective: Vital signs in last 24 hours: Temp:  [97.5 F (36.4 C)-99.2 F (37.3 C)] 99.2 F (37.3 C) (08/18 0924) Pulse Rate:  [95-129] 100 (08/18 0924) Resp:  [8-17] 14 (08/18 0924) BP: (108-138)/(59-87) 122/80 (08/18 0924) SpO2:  [94 %-100 %] 98 % (08/18 0924) Last BM Date : 09/19/23  Intake/Output from previous day: 08/17 0701 - 08/18 0700 In: 1751.4 [P.O.:540; I.V.:849.7; IV Piggyback:301.7] Out: 1420 [Urine:1300; Drains:110; Blood:10] Intake/Output this shift: Total I/O In: 120 [P.O.:120] Out: 10 [Drains:10]  PE: Gen: pleasant, NAD Abd: soft, minimally tender, ND, incisions c/d/I with tegaderm and gauze.  JP with minimal serosang output  Lab Results:  Recent Labs    09/20/23 0443 09/21/23 0413  WBC 9.1 13.4*  HGB 15.6 14.4  HCT 44.1 42.9  PLT 227 272   BMET Recent Labs    09/20/23 0443 09/21/23 0413  NA 133* 132*  K 4.2 3.9  CL 99 96*  CO2 24 24  GLUCOSE 82 111*  BUN 7 11  CREATININE 0.68 0.77  CALCIUM 9.0 9.1   PT/INR No results for input(s): LABPROT, INR in the last 72 hours. CMP     Component Value Date/Time   NA 132 (L) 09/21/2023 0413   K 3.9 09/21/2023 0413   CL 96 (L) 09/21/2023 0413   CO2 24 09/21/2023 0413   GLUCOSE 111 (H) 09/21/2023 0413   BUN 11 09/21/2023 0413   CREATININE 0.77 09/21/2023 0413   CALCIUM 9.1 09/21/2023 0413   PROT 7.6 09/19/2023 1731   ALBUMIN 4.6 09/19/2023 1731   AST 22 09/19/2023 1731   ALT 20 09/19/2023 1731   ALKPHOS 70 09/19/2023 1731   BILITOT 3.2 (H) 09/19/2023 1731   GFRNONAA >60 09/21/2023 0413   GFRAA NOT CALCULATED 04/01/2015 1245   Lipase     Component Value Date/Time   LIPASE 18 09/19/2023 1731       Studies/Results: CT ABDOMEN PELVIS W CONTRAST Result  Date: 09/19/2023 CLINICAL DATA:  Right lower quadrant pain EXAM: CT ABDOMEN AND PELVIS WITH CONTRAST TECHNIQUE: Multidetector CT imaging of the abdomen and pelvis was performed using the standard protocol following bolus administration of intravenous contrast. RADIATION DOSE REDUCTION: This exam was performed according to the departmental dose-optimization program which includes automated exposure control, adjustment of the mA and/or kV according to patient size and/or use of iterative reconstruction technique. CONTRAST:  OMNIPAQUE  IOHEXOL  300 MG/ML  SOLN COMPARISON:  CT 05/28/2022 FINDINGS: Lower chest: No acute abnormality. Hepatobiliary: No focal liver abnormality is seen. Status post cholecystectomy. No biliary dilatation. Pancreas: Unremarkable. No pancreatic ductal dilatation or surrounding inflammatory changes. Spleen: Normal in size without focal abnormality. Adrenals/Urinary Tract: Adrenal glands are unremarkable. Kidneys are normal, without renal calculi, focal lesion, or hydronephrosis. Bladder is unremarkable. Stomach/Bowel: Stomach is nonenlarged. There is no dilated small bowel. Abnormal appendix. Appendix is enlarged, measuring 11 mm. Considerable periappendiceal and right lower quadrant soft tissue stranding. No extraluminal gas. No rim enhancing collection to suggest an abscess. Appendix is retrocecal with ascending orientation. Vascular/Lymphatic: Nonaneurysmal aorta. Small right lower quadrant mesenteric nodes measuring up to 8 mm. Reproductive: Prostate is unremarkable. Other: No free air.  Small volume free fluid in the pelvis Musculoskeletal: No acute osseous abnormality IMPRESSION: 1.  Findings consistent with acute appendicitis. Considerable periappendiceal and right lower quadrant soft tissue stranding. No extraluminal gas to suggest perforation or rim enhancing collection to suggest an abscess. Multiple small right lower quadrant lymph nodes, likely reactive 2. Small volume free fluid  in the pelvis. Electronically Signed   By: Luke Bun M.D.   On: 09/19/2023 18:57    Anti-infectives: Anti-infectives (From admission, onward)    Start     Dose/Rate Route Frequency Ordered Stop   09/20/23 1800  cefTRIAXone  (ROCEPHIN ) 2 g in sodium chloride  0.9 % 100 mL IVPB       Placed in And Linked Group   2 g 200 mL/hr over 30 Minutes Intravenous Daily-1800 09/19/23 2046 09/26/23 1759   09/20/23 1000  metroNIDAZOLE  (FLAGYL ) IVPB 500 mg       Placed in And Linked Group   500 mg 100 mL/hr over 60 Minutes Intravenous 2 times daily 09/19/23 2046 09/26/23 0959   09/19/23 1915  cefTRIAXone  (ROCEPHIN ) 2 g in sodium chloride  0.9 % 100 mL IVPB       Placed in And Linked Group   2 g 200 mL/hr over 30 Minutes Intravenous  Once 09/19/23 1904 09/19/23 2002   09/19/23 1915  metroNIDAZOLE  (FLAGYL ) IVPB 500 mg       Placed in And Linked Group   500 mg 100 mL/hr over 60 Minutes Intravenous  Once 09/19/23 1904 09/19/23 2108        Assessment/Plan POD 1, s/p lap appy with JP drain placement, Dr. Belinda, 09/20/23 -doing great so far today -resume home meds -adv to regular diet -continue JP drain, ? DC prior to discharge vs in office next week -still on abx therapy, but no perforation, may can stop after 24 hrs. -cont to mobilize, pulm toilet -likely home tomorrow if continues to do well   FEN - regular, SLIV VTE - lovenox  ID - Rocephin /Flagyl     LOS: 1 day    Burnard FORBES Banter , [facility] Surgery 09/21/2023, 12:18 PM Please see Amion for pager number during day hours 7:00am-4:30pm or 7:00am -11:30am on weekends

## 2023-09-21 NOTE — Progress Notes (Signed)
 Patient notified NT that he voided on his own a little after midnight in the toilet and that it was a long time that he was voiding. Patient was asked to void in urinal next time so we can keep a record of how much he is voiding.

## 2023-09-22 LAB — SURGICAL PATHOLOGY

## 2023-09-22 MED ORDER — LISDEXAMFETAMINE DIMESYLATE 60 MG PO CAPS: 60.0000 mg | ORAL_CAPSULE | ORAL | Status: DC

## 2023-09-22 MED ORDER — ACETAMINOPHEN 500 MG PO TABS: 1000.0000 mg | ORAL_TABLET | Freq: Three times a day (TID) | ORAL | Status: AC | PRN

## 2023-09-22 NOTE — Discharge Summary (Signed)
 [facility] Discharge Summary   Patient ID: Jonathan Cardenas MRN: [id number] DOB/AGE: Jun 28, 2000 23 y.o.  Admit date: 09/19/2023 Discharge date: 09/22/2023  Admitting Diagnosis: Acute appendicitis   Discharge Diagnosis Same  Consultants None   Imaging: No results found.  Procedures Dr. Donnice Lima (09/20/23) - Laparoscopic Appendectomy  Hospital Course:  Patient is a 23 year old male who presented to the ED with abdominal pain.  Workup showed acute appendicitis.  Patient was admitted and underwent procedure listed above.  Tolerated procedure well and was transferred to the floor.  Diet was advanced as tolerated.  On POD2, the patient was voiding well, tolerating diet, ambulating well, pain well controlled, vital signs stable, incisions c/d/i and felt stable for discharge home. Drain to be removed prior to discharge Patient will follow up in our office in 3-4 weeks and knows to call with questions or concerns.   Physical Exam: General:  Alert, NAD, pleasant, comfortable Abd:  Soft, ND, mild tenderness, incisions C/D/I, drain with minimal sanguinous drainage    I or a member of my team have reviewed this patient in the Controlled Substance Database.   Allergies as of 09/22/2023       Reactions   Penicillins Hives   Tolerated cephalosporin        Medication List     STOP taking these medications    ondansetron  4 MG tablet Commonly known as: Zofran    oxyCODONE  5 MG immediate release tablet Commonly known as: Oxy IR/ROXICODONE        TAKE these medications    acetaminophen  500 MG tablet Commonly known as: TYLENOL  Take 2 tablets (1,000 mg total) by mouth every 8 (eight) hours as needed for mild pain (pain score 1-3), fever or headache.   ibuprofen  800 MG tablet Commonly known as: ADVIL  Take 1 tablet (800 mg total) by mouth every 8 (eight) hours as needed.   lisdexamfetamine 60 MG capsule Commonly known as: VYVANSE  Take 60 mg by mouth every  morning.   sertraline  50 MG tablet Commonly known as: ZOLOFT  TAKE 1 TABLET BY MOUTH EVERY DAY Oral          Follow-up Information     [name], [name], PA-C Follow up on 10/13/2023.   Specialty: General Surgery Why: 11:00am, Arrive 30 minutes prior to your appointment time, Please bring your insurance card and photo ID Contact information: [address] [place] [address] [facility] [place] [address] [phone]                 Signed: Burnard JONELLE Louder , [facility] Surgery 09/22/2023, 10:32 AM Please see Amion for pager number during day hours 7:00am-4:30pm

## 2023-09-22 NOTE — Plan of Care (Signed)
   Problem: Clinical Measurements: Goal: Will remain free from infection Outcome: Progressing Goal: Diagnostic test results will improve Outcome: Progressing
